# Patient Record
Sex: Female | Born: 1985 | Race: White | Hispanic: No | Marital: Single | Smoking: Current every day smoker
Health system: Southern US, Community
[De-identification: ages and names within clinical notes are randomized; demographics above are authoritative.]

## PROBLEM LIST (undated history)

## (undated) DIAGNOSIS — F909 Attention-deficit hyperactivity disorder, unspecified type: Secondary | ICD-10-CM

## (undated) DIAGNOSIS — N200 Calculus of kidney: Secondary | ICD-10-CM

## (undated) DIAGNOSIS — J45909 Unspecified asthma, uncomplicated: Secondary | ICD-10-CM

## (undated) DIAGNOSIS — F419 Anxiety disorder, unspecified: Secondary | ICD-10-CM

## (undated) HISTORY — PX: TONSILLECTOMY: SUR1361

## (undated) HISTORY — PX: OVARIAN CYST REMOVAL: SHX89

## (undated) HISTORY — PX: ELBOW FRACTURE SURGERY: SHX616

## (undated) HISTORY — PX: EYE MUSCLE SURGERY: SHX370

---

## 2016-07-30 DIAGNOSIS — E039 Hypothyroidism, unspecified: Secondary | ICD-10-CM | POA: Insufficient documentation

## 2016-07-30 DIAGNOSIS — G43909 Migraine, unspecified, not intractable, without status migrainosus: Secondary | ICD-10-CM | POA: Insufficient documentation

## 2016-07-30 DIAGNOSIS — F32A Depression, unspecified: Secondary | ICD-10-CM | POA: Insufficient documentation

## 2016-07-30 DIAGNOSIS — F419 Anxiety disorder, unspecified: Secondary | ICD-10-CM | POA: Insufficient documentation

## 2016-07-30 DIAGNOSIS — F988 Other specified behavioral and emotional disorders with onset usually occurring in childhood and adolescence: Secondary | ICD-10-CM | POA: Insufficient documentation

## 2016-07-30 DIAGNOSIS — F1920 Other psychoactive substance dependence, uncomplicated: Secondary | ICD-10-CM | POA: Insufficient documentation

## 2019-12-23 NOTE — Progress Notes (Deleted)
   New Patient Office Visit  Subjective:  Patient ID: Cassandra [name], female    DOB: 01-16-1985  Age: 34 y.o. MRN: [id number]  CC: No chief complaint on file.   HPI Cassandra [name] presents for ***  No past medical history on file.  *** The histories are not reviewed yet. Please review them in the "History" navigator section and refresh this SmartLink.  No family history on file.  Social History   Socioeconomic History  . Marital status: Single    Spouse name: Not on file  . Number of children: Not on file  . Years of education: Not on file  . Highest education level: Not on file  Occupational History  . Not on file  Tobacco Use  . Smoking status: Not on file  Substance and Sexual Activity  . Alcohol use: Not on file  . Drug use: Not on file  . Sexual activity: Not on file  Other Topics Concern  . Not on file  Social History Narrative  . Not on file   Social Determinants of Health   Financial Resource Strain:   . Difficulty of Paying Living Expenses: Not on file  Food Insecurity:   . Worried About Programme researcher, broadcasting/film/video in the Last Year: Not on file  . Ran Out of Food in the Last Year: Not on file  Transportation Needs:   . Lack of Transportation (Medical): Not on file  . Lack of Transportation (Non-Medical): Not on file  Physical Activity:   . Days of Exercise per Week: Not on file  . Minutes of Exercise per Session: Not on file  Stress:   . Feeling of Stress : Not on file  Social Connections:   . Frequency of Communication with Friends and Family: Not on file  . Frequency of Social Gatherings with Friends and Family: Not on file  . Attends Religious Services: Not on file  . Active Member of Clubs or Organizations: Not on file  . Attends Banker Meetings: Not on file  . Marital Status: Not on file  Intimate Partner Violence:   . Fear of Current or Ex-Partner: Not on file  . Emotionally Abused: Not on file  . Physically Abused: Not on file  .  Sexually Abused: Not on file    ROS Review of Systems  Objective:   Today's Vitals: There were no vitals taken for this visit.  Physical Exam  Assessment & Plan:   Problem List Items Addressed This Visit    None      No outpatient encounter medications on file as of 12/24/2019.   No facility-administered encounter medications on file as of 12/24/2019.    Follow-up: No follow-ups on file.   Jairo Ben, FNP

## 2019-12-24 ENCOUNTER — Ambulatory Visit: Payer: Self-pay | Admitting: Adult Health

## 2020-01-13 NOTE — Progress Notes (Signed)
   New Patient Office Visit  Subjective:  Patient ID: Cassandra [name], female    DOB: 18-May-1985  Age: 34 y.o. MRN: [id number]  No show for new patient appointment.

## 2020-01-14 ENCOUNTER — Ambulatory Visit (INDEPENDENT_AMBULATORY_CARE_PROVIDER_SITE_OTHER): Payer: Medicaid Other | Admitting: Adult Health

## 2020-01-14 DIAGNOSIS — Z91199 Patient's noncompliance with other medical treatment and regimen due to unspecified reason: Secondary | ICD-10-CM | POA: Insufficient documentation

## 2020-01-14 DIAGNOSIS — Z5329 Procedure and treatment not carried out because of patient's decision for other reasons: Secondary | ICD-10-CM

## 2020-04-04 ENCOUNTER — Emergency Department
Admission: EM | Admit: 2020-04-04 | Discharge: 2020-04-05 | Disposition: A | Discharge status: Home / Self Care | Payer: Medicaid Other | Attending: Emergency Medicine | Admitting: Emergency Medicine

## 2020-04-04 ENCOUNTER — Encounter: Payer: Self-pay | Admitting: Emergency Medicine

## 2020-04-04 ENCOUNTER — Emergency Department: Payer: Medicaid Other

## 2020-04-04 ENCOUNTER — Other Ambulatory Visit: Payer: Self-pay

## 2020-04-04 DIAGNOSIS — R112 Nausea with vomiting, unspecified: Secondary | ICD-10-CM

## 2020-04-04 DIAGNOSIS — F17200 Nicotine dependence, unspecified, uncomplicated: Secondary | ICD-10-CM | POA: Insufficient documentation

## 2020-04-04 DIAGNOSIS — O26891 Other specified pregnancy related conditions, first trimester: Secondary | ICD-10-CM | POA: Insufficient documentation

## 2020-04-04 DIAGNOSIS — Z3A10 10 weeks gestation of pregnancy: Secondary | ICD-10-CM | POA: Insufficient documentation

## 2020-04-04 DIAGNOSIS — O99331 Smoking (tobacco) complicating pregnancy, first trimester: Secondary | ICD-10-CM | POA: Insufficient documentation

## 2020-04-04 DIAGNOSIS — R1030 Lower abdominal pain, unspecified: Secondary | ICD-10-CM | POA: Diagnosis not present

## 2020-04-04 DIAGNOSIS — O219 Vomiting of pregnancy, unspecified: Secondary | ICD-10-CM | POA: Insufficient documentation

## 2020-04-04 DIAGNOSIS — R109 Unspecified abdominal pain: Secondary | ICD-10-CM

## 2020-04-04 HISTORY — DX: Calculus of kidney: N20.0

## 2020-04-04 LAB — CBC
HCT: 38.9 % (ref 36.0–46.0)
Hemoglobin: 13 g/dL (ref 12.0–15.0)
MCH: 28.1 pg (ref 26.0–34.0)
MCHC: 33.4 g/dL (ref 30.0–36.0)
MCV: 84 fL (ref 80.0–100.0)
Platelets: 371 10*3/uL (ref 150–400)
RBC: 4.63 MIL/uL (ref 3.87–5.11)
RDW: 12.1 % (ref 11.5–15.5)
WBC: 9.9 10*3/uL (ref 4.0–10.5)
nRBC: 0 % (ref 0.0–0.2)

## 2020-04-04 LAB — COMPREHENSIVE METABOLIC PANEL
ALT: 26 U/L (ref 0–44)
AST: 34 U/L (ref 15–41)
Albumin: 4.4 g/dL (ref 3.5–5.0)
Alkaline Phosphatase: 45 U/L (ref 38–126)
Anion gap: 9 (ref 5–15)
BUN: 11 mg/dL (ref 6–20)
CO2: 22 mmol/L (ref 22–32)
Calcium: 9.1 mg/dL (ref 8.9–10.3)
Chloride: 102 mmol/L (ref 98–111)
Creatinine, Ser: 0.41 mg/dL — ABNORMAL LOW (ref 0.44–1.00)
GFR, Estimated: >60 mL/min (ref >60)
Glucose, Bld: 91 mg/dL (ref 70–99)
Potassium: 3.7 mmol/L (ref 3.5–5.1)
Sodium: 133 mmol/L — ABNORMAL LOW (ref 135–145)
Total Bilirubin: 0.4 mg/dL (ref 0.3–1.2)
Total Protein: 7.5 g/dL (ref 6.5–8.1)

## 2020-04-04 LAB — URINALYSIS, COMPLETE (UACMP) WITH MICROSCOPIC
Bilirubin Urine: NEGATIVE
Glucose, UA: NEGATIVE mg/dL
Ketones, ur: 5 mg/dL — AB
Nitrite: NEGATIVE
Protein, ur: NEGATIVE mg/dL
Specific Gravity, Urine: 1.01 (ref 1.005–1.030)
pH: 6 (ref 5.0–8.0)

## 2020-04-04 LAB — LIPASE, BLOOD: Lipase: 28 U/L (ref 11–51)

## 2020-04-04 LAB — POC URINE PREG, ED: Preg Test, Ur: POSITIVE — AB

## 2020-04-04 LAB — HCG, QUANTITATIVE, PREGNANCY: hCG, Beta Chain, Quant, S: 171659 m[IU]/mL — ABNORMAL HIGH (ref <5)

## 2020-04-04 MED ORDER — PROMETHAZINE HCL 25 MG/ML IJ SOLN
25.0000 mg | Freq: Once | INTRAMUSCULAR | Status: AC
  Administered 2020-04-04: 25 mg via INTRAMUSCULAR
  Filled 2020-04-04: qty 1

## 2020-04-04 NOTE — ED Notes (Signed)
Patient transported to Ultrasound 

## 2020-04-04 NOTE — ED Provider Notes (Addendum)
[facility] Emergency Department Provider Note  ____________________________________________  Time seen: Approximately 8:37 PM  I have reviewed the triage vital signs and the nursing notes.   HISTORY  Chief Complaint Abdominal Pain and Emesis    HPI Cassandra [name] is a 35 y.o. female who presents the emergency department complaining of nausea, vomiting, lower abdominal pain.  Patient states that she has felt "off" some intermittent nausea and vomiting for the past several weeks.  Patient states that her last menstrual cycle was in January and given her symptoms she believes that she may be pregnant.  She is not taken home pregnancy test.  She had of one of her children's liquid Zofran at home and states that this has not been alleviating her symptoms.  No fevers or chills.  Nonbilious and nonbloody vomit.  No diarrhea or constipation.  Patient states that the pain she is experiencing is more of a cramp versus a true pain.  Does not radiate.  Patient does have a history of nephrolithiasis but has had no hematuria.  She has had some urinary frequency but no dysuria.  No vaginal bleeding or discharge.         Past Medical History:  Diagnosis Date  . Kidney stone     Patient Active Problem List   Diagnosis Date Noted  . No-show for appointment 01/14/2020    History reviewed. No pertinent surgical history.  Prior to Admission medications   Not on File    Allergies Patient has no known allergies.  No family history on file.  Social History Social History   Tobacco Use  . Smoking status: Current Every Day Smoker  . Smokeless tobacco: Never Used  Substance Use Topics  . Alcohol use: Not Currently  . Drug use: Not Currently     Review of Systems  Constitutional: No fever/chills Eyes: No visual changes. No discharge ENT: No upper respiratory complaints. Cardiovascular: no chest pain. Respiratory: no cough. No SOB. Gastrointestinal: Lower  abdominal cramping.  Positive for ongoing nausea vomiting.  No diarrhea.  No constipation. Genitourinary: Positive for polyuria.  Negative for dysuria. No hematuria Musculoskeletal: Negative for musculoskeletal pain. Skin: Negative for rash, abrasions, lacerations, ecchymosis. Neurological: Negative for headaches, focal weakness or numbness.  10 System ROS otherwise negative.  ____________________________________________   PHYSICAL EXAM:  VITAL SIGNS: ED Triage Vitals  Enc Vitals Group     BP 04/04/20 1830 134/85     Pulse Rate 04/04/20 1830 100     Resp 04/04/20 1830 (!) 22     Temp 04/04/20 1830 97.8 F (36.6 C)     Temp Source 04/04/20 1830 Oral     SpO2 04/04/20 1830 98 %     Weight 04/04/20 1838 120 lb (54.4 kg)     Height 04/04/20 1838 5\' 2"  (1.575 m)     Head Circumference --      Peak Flow --      Pain Score 04/04/20 1838 8     Pain Loc --      Pain Edu? --      Excl. in GC? --      Constitutional: Alert and oriented. Well appearing and in no acute distress. Eyes: Conjunctivae are normal. PERRL. EOMI. Head: Atraumatic. ENT:      Ears:       Nose: No congestion/rhinnorhea.      Mouth/Throat: Mucous membranes are moist.  Neck: No stridor.   Hematological/Lymphatic/Immunilogical: No cervical lymphadenopathy. Cardiovascular: Normal rate, regular rhythm. Normal S1  and S2.  Good peripheral circulation. Respiratory: Normal respiratory effort without tachypnea or retractions. Lungs CTAB. Good air entry to the bases with no decreased or absent breath sounds. Gastrointestinal: Exam of external abdominal wall reveals no acute findings.  Bowel sounds 4 quadrants. Soft and nontender to palpation all 4 quadrants.  Mild tenderness in the suprapubic region.. No guarding or rigidity. No palpable masses. No distention. No CVA tenderness. Musculoskeletal: Full range of motion to all extremities. No gross deformities appreciated. Neurologic:  Normal speech and language. No gross  focal neurologic deficits are appreciated.  Skin:  Skin is warm, dry and intact. No rash noted. Psychiatric: Mood and affect are normal. Speech and behavior are normal. Patient exhibits appropriate insight and judgement.   ____________________________________________   LABS (all labs ordered are listed, but only abnormal results are displayed)  Labs Reviewed  COMPREHENSIVE METABOLIC PANEL - Abnormal; Notable for the following components:      Result Value   Sodium 133 (*)    Creatinine, Ser 0.41 (*)    All other components within normal limits  URINALYSIS, COMPLETE (UACMP) WITH MICROSCOPIC - Abnormal; Notable for the following components:   Color, Urine YELLOW (*)    APPearance HAZY (*)    Hgb urine dipstick SMALL (*)    Ketones, ur 5 (*)    Leukocytes,Ua TRACE (*)    Bacteria, UA RARE (*)    All other components within normal limits  HCG, QUANTITATIVE, PREGNANCY - Abnormal; Notable for the following components:   hCG, Beta Chain, Quant, S 171,659 (*)    All other components within normal limits  POC URINE PREG, ED - Abnormal; Notable for the following components:   Preg Test, Ur Positive (*)    All other components within normal limits  URINE CULTURE  LIPASE, BLOOD  CBC   ____________________________________________  EKG   ____________________________________________  RADIOLOGY   No results found.  ____________________________________________    PROCEDURES  Procedure(s) performed:    Procedures    Medications  promethazine (PHENERGAN) injection 25 mg (25 mg Intramuscular Given 04/04/20 2317)     ____________________________________________   INITIAL IMPRESSION / ASSESSMENT AND PLAN / ED COURSE  Pertinent labs & imaging results that were available during my care of the patient were reviewed by me and considered in my medical decision making (see chart for details).  Review of the [place] CSRS was performed in accordance of the [facility] prior to dispensing  any controlled drugs.          Patient presented to emergency department for off and on nausea and vomiting x2 months.  Patient's last menstrual cycle was in January and she states "I felt like it might be pregnant based off of this."  Patient not had not establish care with OB/GYN.  She had not been taking medications.  Patient had some mild increase in urination as well as some suprapubic discomfort and she was not sure whether "this was my pregnancy or I might have a UTI."  Patient does not have any antiemetics at home.  No fevers or chills, no URI symptoms, no chest pain, shortness of breath.  Patient denies any vaginal bleeding or vaginal discharge.  Patient does have a history of nephrolithiasis.  Given the urinary frequency, vague abdominal discomfort, I ordered ultrasounds for evaluation of pregnancy as well as a renal ultrasound to ensure no hydronephrosis or pyelonephritis.  Patient did have some bacteria and leukocytes in her urine and will treat for bacteriuria.  When patient was  to go for ultrasound she had an episode of nausea and vomiting, was given antiemetics with good improvement.  At this time, we are waiting ultrasound for final diagnoses and disposition.  I have handed the patient off to Dr. Elesa Massed for final diagnosis and disposition.  Assuming that ultrasounds are reassuring, I suspect the patient will be discharged with antibiotics and OB/GYN follow-up.     This chart was dictated using voice recognition software/Dragon. Despite best efforts to proofread, errors can occur which can change the meaning. Any change was purely unintentional.        Racheal Patches, PA-C 04/05/20 0015    Ward, Layla Maw, DO 04/05/20 [phone]

## 2020-04-04 NOTE — ED Triage Notes (Signed)
Pt to ED via POV with c/o emesis and abdominal pain x several days. Pt states also has HA at this time. Pt states she feels shakey and dizzy at this time. Pt A&O x4, ambulatory at this time.

## 2020-04-04 NOTE — ED Notes (Signed)
Secure chat to Rake PA for requested nausea medication.

## 2020-04-05 ENCOUNTER — Encounter: Payer: Self-pay | Admitting: Radiology

## 2020-04-05 MED ORDER — METOCLOPRAMIDE HCL 10 MG PO TABS: 10.0000 mg | ORAL_TABLET | Freq: Four times a day (QID) | ORAL | 0 refills | Status: DC | PRN

## 2020-04-05 NOTE — Discharge Instructions (Addendum)
You may take Tylenol 1000 mg every 6 hours as needed for pain.  This medication is found over-the-counter.  Please avoid NSAIDs such as ibuprofen, aspirin, Aleve, Goody powders while pregnant.  Your ultrasound showed a pregnancy measuring about 10 weeks and 1 day with an estimated due date of 10/30/2020.  I recommend that you start taking over the counter prenatal vitamins and follow-up with an OB/GYN as an outpatient for further prenatal care.

## 2020-04-05 NOTE — ED Provider Notes (Signed)
12:10 AM  Assumed care.  Patient here with abdominal cramping, nausea and vomiting.  History of nephrolithiasis.  Found to be pregnant here.  Urine shows trace leukocytes and rare bacteria.  Will cover with antibiotics.  Culture pending.  OB and renal ultrasound pending for disposition.  1:17 AM  Pt's renal ultrasound shows no acute abnormality.  No hydronephrosis, urolithiasis.  OB ultrasound shows a single intrauterine pregnancy that measures 10 weeks and 1 day with an estimated due date of 10/30/2020 without other acute abnormality.  She reports feeling better at this time.  Pain and nausea have improved.  She is requesting something for nausea at home as she states Zofran has not been helping her.  Will discharge with Reglan.  Recommended Tylenol as needed for pain.  Will give outpatient OB follow-up.  She denies any vaginal bleeding.  I feel she is safe for discharge home.   At this time, I do not feel there is any life-threatening condition present. I have reviewed, interpreted and discussed all results (EKG, imaging, lab, urine as appropriate) and exam findings with patient/family. I have reviewed nursing notes and appropriate previous records.  I feel the patient is safe to be discharged home without further emergent workup and can continue workup as an outpatient as needed. Discussed usual and customary return precautions. Patient/family verbalize understanding and are comfortable with this plan.  Outpatient follow-up has been provided as needed. All questions have been answered.    Zair Borawski, Layla Maw, DO 04/05/20 [phone]

## 2020-04-06 LAB — URINE CULTURE

## 2020-09-13 ENCOUNTER — Other Ambulatory Visit (HOSPITAL_COMMUNITY)
Admission: RE | Admit: 2020-09-13 | Discharge: 2020-09-13 | Disposition: A | Discharge status: Home / Self Care | Payer: Medicaid Other | Source: Ambulatory Visit | Attending: Obstetrics | Admitting: Obstetrics

## 2020-09-13 ENCOUNTER — Other Ambulatory Visit: Payer: Self-pay

## 2020-09-13 ENCOUNTER — Ambulatory Visit (INDEPENDENT_AMBULATORY_CARE_PROVIDER_SITE_OTHER): Payer: Medicaid Other | Admitting: Obstetrics

## 2020-09-13 ENCOUNTER — Encounter: Payer: Self-pay | Admitting: Obstetrics

## 2020-09-13 VITALS — BP 128/78 | HR 94 | Wt 123.0 lb

## 2020-09-13 DIAGNOSIS — Z3A34 34 weeks gestation of pregnancy: Secondary | ICD-10-CM | POA: Insufficient documentation

## 2020-09-13 DIAGNOSIS — O0990 Supervision of high risk pregnancy, unspecified, unspecified trimester: Secondary | ICD-10-CM | POA: Diagnosis present

## 2020-09-13 DIAGNOSIS — O99323 Drug use complicating pregnancy, third trimester: Secondary | ICD-10-CM

## 2020-09-13 DIAGNOSIS — Z3480 Encounter for supervision of other normal pregnancy, unspecified trimester: Secondary | ICD-10-CM

## 2020-09-13 DIAGNOSIS — O0933 Supervision of pregnancy with insufficient antenatal care, third trimester: Secondary | ICD-10-CM | POA: Insufficient documentation

## 2020-09-13 LAB — OB RESULTS CONSOLE GC/CHLAMYDIA
Chlamydia: NEGATIVE
Gonorrhea: NEGATIVE

## 2020-09-13 NOTE — Progress Notes (Signed)
NOB - No prenatal care, just seen Health for her for ultrasound. RM 4
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
## 2020-09-14 LAB — CYTOLOGY - PAP
Adequacy: ABSENT
Chlamydia: NEGATIVE
Comment: NEGATIVE
Comment: NEGATIVE
Comment: NEGATIVE
Comment: NORMAL
Diagnosis: NEGATIVE
High risk HPV: NEGATIVE
Neisseria Gonorrhea: NEGATIVE
Trichomonas: NEGATIVE

## 2020-09-15 ENCOUNTER — Telehealth: Payer: Self-pay

## 2020-09-16 LAB — URINE CULTURE: Organism ID, Bacteria: NO GROWTH

## 2020-09-17 LAB — URINE DRUG PANEL 7
Amphetamines, Urine: POSITIVE — AB
Barbiturate Quant, Ur: NEGATIVE ng/mL
Benzodiazepine Quant, Ur: NEGATIVE ng/mL
Cannabinoid Quant, Ur: NEGATIVE ng/mL
Cocaine (Metab.): NEGATIVE ng/mL
Opiate Quant, Ur: NEGATIVE ng/mL
PCP Quant, Ur: NEGATIVE ng/mL

## 2020-09-18 ENCOUNTER — Encounter: Payer: Self-pay | Admitting: Obstetrics and Gynecology

## 2020-09-18 ENCOUNTER — Inpatient Hospital Stay
Admission: EM | Admit: 2020-09-18 | Discharge: 2020-09-19 | DRG: 776 | Disposition: A | Discharge status: Home / Self Care | Payer: Medicaid Other | Attending: Advanced Practice Midwife | Admitting: Advanced Practice Midwife

## 2020-09-18 ENCOUNTER — Other Ambulatory Visit: Payer: Self-pay

## 2020-09-18 DIAGNOSIS — Z87442 Personal history of urinary calculi: Secondary | ICD-10-CM

## 2020-09-18 DIAGNOSIS — Z20822 Contact with and (suspected) exposure to covid-19: Secondary | ICD-10-CM | POA: Diagnosis present

## 2020-09-18 DIAGNOSIS — O0993 Supervision of high risk pregnancy, unspecified, third trimester: Secondary | ICD-10-CM

## 2020-09-18 DIAGNOSIS — Z880 Allergy status to penicillin: Secondary | ICD-10-CM | POA: Diagnosis not present

## 2020-09-18 DIAGNOSIS — Z3A34 34 weeks gestation of pregnancy: Secondary | ICD-10-CM

## 2020-09-18 DIAGNOSIS — O99323 Drug use complicating pregnancy, third trimester: Secondary | ICD-10-CM

## 2020-09-18 HISTORY — DX: Unspecified asthma, uncomplicated: J45.909

## 2020-09-18 HISTORY — DX: Anxiety disorder, unspecified: F41.9

## 2020-09-18 HISTORY — DX: Attention-deficit hyperactivity disorder, unspecified type: F90.9

## 2020-09-18 LAB — URINE DRUG SCREEN, QUALITATIVE (ARMC ONLY)
Amphetamines, Ur Screen: POSITIVE — AB
Barbiturates, Ur Screen: NOT DETECTED
Benzodiazepine, Ur Scrn: NOT DETECTED
Cannabinoid 50 Ng, Ur ~~LOC~~: NOT DETECTED
Cocaine Metabolite,Ur ~~LOC~~: NOT DETECTED
MDMA (Ecstasy)Ur Screen: NOT DETECTED
Methadone Scn, Ur: NOT DETECTED
Opiate, Ur Screen: NOT DETECTED
Phencyclidine (PCP) Ur S: NOT DETECTED
Tricyclic, Ur Screen: NOT DETECTED

## 2020-09-18 LAB — RAPID HIV SCREEN (HIV 1/2 AB+AG)
HIV 1/2 Antibodies: NONREACTIVE
HIV-1 P24 Antigen - HIV24: NONREACTIVE

## 2020-09-18 LAB — ABO/RH: ABO/RH(D): O POS

## 2020-09-18 LAB — RESP PANEL BY RT-PCR (FLU A&B, COVID) ARPGX2
Influenza A by PCR: NEGATIVE
Influenza B by PCR: NEGATIVE
SARS Coronavirus 2 by RT PCR: NEGATIVE

## 2020-09-18 LAB — TYPE AND SCREEN
ABO/RH(D): O POS
Antibody Screen: NEGATIVE

## 2020-09-18 MED ORDER — TETANUS-DIPHTH-ACELL PERTUSSIS 5-2.5-18.5 LF-MCG/0.5 IM SUSY: 0.5000 mL | PREFILLED_SYRINGE | Freq: Once | INTRAMUSCULAR | Status: DC

## 2020-09-18 MED ORDER — AMPHETAMINE-DEXTROAMPHET ER 30 MG PO CP24
30.0000 mg | ORAL_CAPSULE | Freq: Two times a day (BID) | ORAL | Status: DC
  Administered 2020-09-18 – 2020-09-19 (×3): 30 mg via ORAL
  Filled 2020-09-18: qty 1
  Filled 2020-09-18 (×2): qty 6

## 2020-09-18 MED ORDER — ACETAMINOPHEN 325 MG PO TABS
650.0000 mg | ORAL_TABLET | ORAL | Status: DC | PRN
  Administered 2020-09-19: 650 mg via ORAL
  Filled 2020-09-18: qty 2

## 2020-09-18 MED ORDER — ONDANSETRON HCL 4 MG PO TABS: 4.0000 mg | ORAL_TABLET | ORAL | Status: DC | PRN

## 2020-09-18 MED ORDER — LACTATED RINGERS IV SOLN: 500.0000 mL | INTRAVENOUS | Status: DC | PRN

## 2020-09-18 MED ORDER — PRENATAL MULTIVITAMIN CH: 1.0000 | ORAL_TABLET | Freq: Every day | ORAL | Status: DC

## 2020-09-18 MED ORDER — WITCH HAZEL-GLYCERIN EX PADS: 1.0000 "application " | MEDICATED_PAD | CUTANEOUS | Status: DC | PRN

## 2020-09-18 MED ORDER — BUPRENORPHINE HCL 8 MG SL SUBL
8.0000 mg | SUBLINGUAL_TABLET | Freq: Two times a day (BID) | SUBLINGUAL | Status: DC
  Administered 2020-09-18 – 2020-09-19 (×3): 8 mg via SUBLINGUAL
  Filled 2020-09-18 (×3): qty 1

## 2020-09-18 MED ORDER — COCONUT OIL OIL
1.0000 "application " | TOPICAL_OIL | Status: DC | PRN
  Filled 2020-09-18: qty 120

## 2020-09-18 MED ORDER — IBUPROFEN 600 MG PO TABS: 600.0000 mg | ORAL_TABLET | Freq: Four times a day (QID) | ORAL | Status: DC

## 2020-09-18 MED ORDER — SOD CITRATE-CITRIC ACID 500-334 MG/5ML PO SOLN: 30.0000 mL | ORAL | Status: DC | PRN

## 2020-09-18 MED ORDER — DIPHENHYDRAMINE HCL 25 MG PO CAPS: 25.0000 mg | ORAL_CAPSULE | Freq: Four times a day (QID) | ORAL | Status: DC | PRN

## 2020-09-18 MED ORDER — ONDANSETRON HCL 4 MG PO TABS
4.0000 mg | ORAL_TABLET | ORAL | Status: DC | PRN
  Filled 2020-09-18: qty 1

## 2020-09-18 MED ORDER — ZOLPIDEM TARTRATE 5 MG PO TABS: 5.0000 mg | ORAL_TABLET | Freq: Every evening | ORAL | Status: DC | PRN

## 2020-09-18 MED ORDER — IBUPROFEN 600 MG PO TABS
ORAL_TABLET | ORAL | Status: AC
  Filled 2020-09-18: qty 1

## 2020-09-18 MED ORDER — OXYTOCIN BOLUS FROM INFUSION: 333.0000 mL | Freq: Once | INTRAVENOUS | Status: DC

## 2020-09-18 MED ORDER — DIBUCAINE (PERIANAL) 1 % EX OINT: 1.0000 "application " | TOPICAL_OINTMENT | CUTANEOUS | Status: DC | PRN

## 2020-09-18 MED ORDER — ACETAMINOPHEN 500 MG PO TABS
1000.0000 mg | ORAL_TABLET | Freq: Four times a day (QID) | ORAL | Status: DC
  Administered 2020-09-18: 1000 mg via ORAL
  Filled 2020-09-18: qty 2

## 2020-09-18 MED ORDER — SIMETHICONE 80 MG PO CHEW: 80.0000 mg | CHEWABLE_TABLET | ORAL | Status: DC | PRN

## 2020-09-18 MED ORDER — LACTATED RINGERS IV SOLN: INTRAVENOUS | Status: DC

## 2020-09-18 MED ORDER — ONDANSETRON HCL 4 MG/2ML IJ SOLN: 4.0000 mg | INTRAMUSCULAR | Status: DC | PRN

## 2020-09-18 MED ORDER — PRENATAL MULTIVITAMIN CH
1.0000 | ORAL_TABLET | Freq: Every day | ORAL | Status: DC
  Administered 2020-09-19: 1 via ORAL
  Filled 2020-09-18: qty 1

## 2020-09-18 MED ORDER — IBUPROFEN 600 MG PO TABS
600.0000 mg | ORAL_TABLET | Freq: Four times a day (QID) | ORAL | Status: DC
  Administered 2020-09-18 – 2020-09-19 (×5): 600 mg via ORAL
  Filled 2020-09-18 (×4): qty 1

## 2020-09-18 MED ORDER — LIDOCAINE HCL (PF) 1 % IJ SOLN: 30.0000 mL | INTRAMUSCULAR | Status: DC | PRN

## 2020-09-18 MED ORDER — ESCITALOPRAM OXALATE 10 MG PO TABS
20.0000 mg | ORAL_TABLET | Freq: Every day | ORAL | Status: DC
  Administered 2020-09-18 – 2020-09-19 (×2): 20 mg via ORAL
  Filled 2020-09-18: qty 2
  Filled 2020-09-18: qty 1

## 2020-09-18 MED ORDER — AMPHETAMINE-DEXTROAMPHET ER 5 MG PO CP24
30.0000 mg | ORAL_CAPSULE | Freq: Two times a day (BID) | ORAL | Status: DC
  Filled 2020-09-18: qty 1

## 2020-09-18 MED ORDER — BENZOCAINE-MENTHOL 20-0.5 % EX AERO: 1.0000 "application " | INHALATION_SPRAY | CUTANEOUS | Status: DC | PRN

## 2020-09-18 MED ORDER — BUPROPION HCL ER (SR) 150 MG PO TB12
150.0000 mg | ORAL_TABLET | Freq: Every morning | ORAL | Status: DC
  Administered 2020-09-18 – 2020-09-19 (×2): 150 mg via ORAL
  Filled 2020-09-18 (×2): qty 1

## 2020-09-18 MED ORDER — OXYTOCIN-SODIUM CHLORIDE 30-0.9 UT/500ML-% IV SOLN: 2.5000 [IU]/h | INTRAVENOUS | Status: DC

## 2020-09-18 MED ORDER — DOCUSATE SODIUM 100 MG PO CAPS
100.0000 mg | ORAL_CAPSULE | Freq: Two times a day (BID) | ORAL | Status: DC
  Administered 2020-09-18 – 2020-09-19 (×2): 100 mg via ORAL
  Filled 2020-09-18 (×2): qty 1

## 2020-09-18 MED ORDER — TRAZODONE HCL 100 MG PO TABS
200.0000 mg | ORAL_TABLET | Freq: Every evening | ORAL | Status: DC | PRN
  Administered 2020-09-18: 200 mg via ORAL
  Filled 2020-09-18 (×3): qty 2

## 2020-09-18 MED ORDER — ONDANSETRON HCL 4 MG/2ML IJ SOLN: 4.0000 mg | Freq: Four times a day (QID) | INTRAMUSCULAR | Status: DC | PRN

## 2020-09-18 MED ORDER — COCONUT OIL OIL: 1.0000 "application " | TOPICAL_OIL | Status: DC | PRN

## 2020-09-18 MED ORDER — SENNOSIDES-DOCUSATE SODIUM 8.6-50 MG PO TABS
2.0000 | ORAL_TABLET | Freq: Every day | ORAL | Status: DC
  Administered 2020-09-19: 2 via ORAL
  Filled 2020-09-18: qty 2

## 2020-09-18 NOTE — H&P (Addendum)
OB History & Physical   History of Present Illness:  Chief Complaint: SVD and delivery of placenta at home, transported to hospital by EMS. Stable on admission. Newborn to [facility].  HPI:  Cassandra [name] is a 35 y.o. [phone] female at [redacted]w[redacted]d dated by 10 week u/s.  Her pregnancy has been complicated by substance abuse currently on subutex, Adderall, Wellbutrin, Lexapro, Trazodone; mental illness; previous tubal pregnancy.    She was at home this morning and feeling contractions that she thought were BH. She went to the bathroom and her water broke and subsequent delivery of female infant at 8:45 AM. Placenta was also delivered at home. She then was transported to the hospital by EMS and was stable on arrival. Her newborn was taken to the special care nursery for transition care. She has 3 other children that live in the home. She signed a consent for BTL last week at clinic visit per her report.  Total weight gain for pregnancy: 8.165 kg   Obstetrical Problem List: pregnancy 4 Problems (from 09/13/20 to present)     Problem Noted Resolved   Supervision of high risk pregnancy, antepartum 09/13/2020 by Mirna Mires, CNM No   Overview Addendum 09/15/2020 12:50 PM by Mirna Mires, CNM     Nursing Staff Provider  Office Location  [facility] Dating    Language  English Anatomy US    Flu Vaccine   Genetic Screen  NIPS:   TDaP vaccine    Hgb A1C or  GTT Early : Third trimester :   Covid    LAB RESULTS   Rhogam   Blood Type     Feeding Plan  Breast? Antibody    Contraception Tubal only one tube Rubella    Circumcision  RPR     Pediatrician   HBsAg     Support Person  HIV    Prenatal Classes  Varicella     GBS  (For PCN allergy, check sensitivities)   BTL Consent Signed 09/13/20    VBAC Consent  Pap 09/13/2020 NILM    Hgb Electro      CF      SMA              Late prenatal care in third trimester 09/13/2020 by Mirna Mires, CNM No   Overview Signed 09/13/2020 12:12 PM by Mirna Mires, CNM    NOB at approximately [redacted] weeks gestation on 8/30 2022           Maternal Medical History:   Past Medical History:  Diagnosis Date   ADHD (attention deficit hyperactivity disorder)    Anxiety    Asthma    Kidney stone     Past Surgical History:  Procedure Laterality Date   ELBOW FRACTURE SURGERY     EYE MUSCLE SURGERY Bilateral    OVARIAN CYST REMOVAL Right    TONSILLECTOMY      Allergies  Allergen Reactions   Metronidazole Nausea And Vomiting   Prednisone Other (See Comments)    irritable/ angry and very lethargic when finishes    Prior to Admission medications   Medication Sig Start Date End Date Taking? Authorizing Provider  ADDERALL XR 30 MG 24 hr capsule Take 30 mg by mouth 2 (two) times daily. 09/01/20  Yes [provider]  buprenorphine (SUBUTEX) 8 MG SUBL SL tablet Place 8 mg under the tongue 2 (two) times daily. 09/09/20  Yes [provider]  buPROPion (WELLBUTRIN SR) 150 MG 12 hr  tablet Take 150 mg by mouth every morning. 08/21/20  Yes [provider]  escitalopram (LEXAPRO) 20 MG tablet Take 20 mg by mouth daily. 09/01/20  Yes [provider]  traZODone (DESYREL) 100 MG tablet Take 200 mg by mouth at bedtime as needed. 09/13/20  Yes [provider]  amoxicillin-clavulanate (AUGMENTIN) 875-125 MG tablet SMARTSIG:1 Tablet(s) By Mouth Every 12 Hours Patient not taking: No sig reported 09/09/20   [provider]  metoCLOPramide (REGLAN) 10 MG tablet Take 1 tablet (10 mg total) by mouth every 6 (six) hours as needed for nausea. Patient not taking: No sig reported 04/05/20 04/05/21  Ward, Baxter Hire N, DO  ondansetron (ZOFRAN-ODT) 4 MG disintegrating tablet Take 4 mg by mouth every 8 (eight) hours as needed. 09/01/20   [provider]  promethazine (PHENERGAN) 25 MG suppository SMARTSIG:1 SUPPOS Rectally Every 12 Hours PRN Patient not taking: Reported on 09/18/2020 09/08/20   [provider]    OB  History  Gravida Para Term Preterm AB Living  1            SAB IAB Ectopic Multiple Live Births               # Outcome Date GA Lbr Len/2nd Weight Sex Delivery Anes PTL Lv  1 Current             Prenatal care site: [facility], 1 prenatal visit  Social History: She  reports that she has quit smoking. Her smoking use included cigarettes. She has never used smokeless tobacco. She reports that she does not currently use alcohol. She reports that she does not currently use drugs.  Family History: no family history of breast or ovarian cancer   Review of Systems:  Review of Systems  Constitutional:  Negative for chills and fever.  HENT:  Negative for congestion, ear discharge, ear pain, hearing loss, sinus pain and sore throat.   Eyes:  Negative for blurred vision and double vision.  Respiratory:  Negative for cough, shortness of breath and wheezing.   Cardiovascular:  Negative for chest pain, palpitations and leg swelling.  Gastrointestinal:  Positive for abdominal pain. Negative for blood in stool, constipation, diarrhea, heartburn, melena, nausea and vomiting.  Genitourinary:  Negative for dysuria, flank pain, frequency, hematuria and urgency.  Musculoskeletal:  Negative for back pain, joint pain and myalgias.  Skin:  Negative for itching and rash.  Neurological:  Negative for dizziness, tingling, tremors, sensory change, speech change, focal weakness, seizures, loss of consciousness, weakness and headaches.  Endo/Heme/Allergies:  Negative for environmental allergies. Does not bruise/bleed easily.  Psychiatric/Behavioral:  Negative for depression, hallucinations, memory loss, substance abuse and suicidal ideas. The patient is not nervous/anxious and does not have insomnia.     Physical Exam:  BP 120/78 (BP Location: Left Arm)   Pulse 70   Temp 98.3 F (36.8 C) (Oral)   Resp 16   Ht 5\' 2"  (1.575 m)   Wt 53.5 kg Comment: 118lbs  SpO2 100%   BMI 21.58 kg/m   Constitutional:  petite, well developed female in no acute distress.  HEENT: normal Skin: Warm and dry.  Cardiovascular: Regular rate and rhythm.   Extremity:  no edema   Respiratory: Clear to auscultation bilateral. Normal respiratory effort Abdomen: fundus firm below U Psych: Alert and Oriented x3. No memory deficits. Normal mood and affect.   Pelvic exam: (female chaperone present) is not limited by body habitus EGBUS: within normal limit Vagina: within normal limits and with  normal mucosa, blood in the vault, no lesions or lacerations on inspection, amount of bleeding wnl    Lab Results  Component Value Date   SARSCOV2NAA NEGATIVE 09/18/2020    Assessment:  Cassandra [name] is a 35 y.o. [id number] female at 16 weeks postpartum, stable  Plan:  Admit to Postpartum CBC, T&S, RPR, HIV Regular diet Newborn in SCN- patient would like to breastfeed- will consult with [facility] regarding patient medications Placenta to pathology Home medications ordered including Subutex Desires interval tubal- consent signed 09/13/20   Tresea Mall, CNM 09/18/2020 1:47 PM

## 2020-09-19 DIAGNOSIS — O99323 Drug use complicating pregnancy, third trimester: Secondary | ICD-10-CM

## 2020-09-19 LAB — CBC
HCT: 30.1 % — ABNORMAL LOW (ref 36.0–46.0)
Hemoglobin: 9.8 g/dL — ABNORMAL LOW (ref 12.0–15.0)
MCH: 25.5 pg — ABNORMAL LOW (ref 26.0–34.0)
MCHC: 32.6 g/dL (ref 30.0–36.0)
MCV: 78.2 fL — ABNORMAL LOW (ref 80.0–100.0)
Platelets: 517 10*3/uL — ABNORMAL HIGH (ref 150–400)
RBC: 3.85 MIL/uL — ABNORMAL LOW (ref 3.87–5.11)
RDW: 14.6 % (ref 11.5–15.5)
WBC: 18.5 10*3/uL — ABNORMAL HIGH (ref 4.0–10.5)
nRBC: 0 % (ref 0.0–0.2)

## 2020-09-19 LAB — RPR: RPR Ser Ql: NONREACTIVE

## 2020-09-19 LAB — HEPATITIS B SURFACE ANTIGEN: Hepatitis B Surface Ag: NONREACTIVE

## 2020-09-19 MED ORDER — IBUPROFEN 600 MG PO TABS: 600.0000 mg | ORAL_TABLET | Freq: Four times a day (QID) | ORAL | 0 refills | Status: DC

## 2020-09-19 NOTE — Discharge Summary (Signed)
Postpartum Discharge Summary  Date of Service updated09/05/2020     Patient Name: Cassandra [name] DOB: 1985/06/17 MRN: [id number]  Date of admission: 09/18/2020 Delivery date:09/18/2020  Delivering provider:   Date of discharge: 09/19/2020  Admitting diagnosis: Vaginal delivery [O80] Postpartum care following vaginal delivery [Z39.2] Intrauterine pregnancy: [redacted]w[redacted]d    Secondary diagnosis:  Active Problems:   Vaginal delivery   Postpartum care following vaginal delivery   Supervision of high risk pregnancy in third trimester   [redacted] weeks gestation of pregnancy   Encounter for postpartum care after unplanned out of hospital delivery   Substance abuse affecting pregnancy in third trimester, antepartum  Additional problems: substance abuse    Discharge diagnosis: Preterm Pregnancy Delivered and Delviery outside the hospital                                               Post partum procedures: none Augmentation: N/A Complications: None  Hospital course: Patient brought to the hospital by EMS having delivered at home. Preterm delivery, SVD over intact perineum. Her postpartum course marked only by concerns of ongoing substance use, and little prenatal care. She is discharged home on postpartum day 1 after seeing the SEducation officer, museum with [facility] [place] [facility]  Magnesium Sulfate received: No BMZ received: No Rhophylac:N/A MMR:No T-DaP:Given postpartum Flu: No Transfusion:No  Physical exam  Vitals:   09/18/20 2037 09/19/20 0018 09/19/20 0445 09/19/20 0917  BP: 131/87 108/67 122/73 117/77  Pulse: 90 83 87 88  Resp: _0 Temp: 98 F (36.7 C) 97.7 F (36.5 C) 98.1 F (36.7 C) 97.8 F (36.6 C)  TempSrc: Oral Oral Oral Oral  SpO2: 99%  97% 96%  Weight:      Height:       General: cooperative and no distress Lochia: appropriate Uterine Fundus: firm Incision: N/A DVT Evaluation: No evidence of DVT seen on physical exam. Negative Homan's  sign. Labs: Lab Results  Component Value Date   WBC 18.5 (H) 09/19/2020   HGB 9.8 (L) 09/19/2020   HCT 30.1 (L) 09/19/2020   MCV 78.2 (L) 09/19/2020   PLT 517 (H) 09/19/2020   CMP Latest Ref Rng & Units 04/04/2020  Glucose 70 - 99 mg/dL 91  BUN 6 - 20 mg/dL 11  Creatinine 0.44 - 1.00 mg/dL 0.41(L)  Sodium 135 - 145 mmol/L 133(L)  Potassium 3.5 - 5.1 mmol/L 3.7  Chloride 98 - 111 mmol/L 102  CO2 22 - 32 mmol/L 22  Calcium 8.9 - 10.3 mg/dL 9.1  Total Protein 6.5 - 8.1 g/dL 7.5  Total Bilirubin 0.3 - 1.2 mg/dL 0.4  Alkaline Phos 38 - 126 U/L 45  AST 15 - 41 U/L 34  ALT 0 - 44 U/L 26   Edinburgh Score: Edinburgh Postnatal Depression Scale Screening Tool 09/18/2020  I have been able to laugh and see the funny side of things. 0  I have looked forward with enjoyment to things. 0  I have blamed myself unnecessarily when things went wrong. 0  I have been anxious or worried for no good reason. 0  I have felt scared or panicky for no good reason. 0  Things have been getting on top of me. 0  I have been so unhappy that I have had difficulty sleeping. 0  I have felt  sad or miserable. 0  I have been so unhappy that I have been crying. 0  The thought of harming myself has occurred to me. 0  Edinburgh Postnatal Depression Scale Total 0      After visit meds:  Allergies as of 09/19/2020       Reactions   Metronidazole Nausea And Vomiting   Prednisone Other (See Comments)   irritable/ angry and very lethargic when finishes        Medication List     STOP taking these medications    metoCLOPramide 10 MG tablet Commonly known as: REGLAN   ondansetron 4 MG disintegrating tablet Commonly known as: ZOFRAN-ODT   promethazine 25 MG suppository Commonly known as: PHENERGAN       TAKE these medications    Adderall XR 30 MG 24 hr capsule Generic drug: amphetamine-dextroamphetamine Take 30 mg by mouth 2 (two) times daily.   amoxicillin-clavulanate 875-125 MG tablet Commonly  known as: AUGMENTIN SMARTSIG:1 Tablet(s) By Mouth Every 12 Hours   buprenorphine 8 MG Subl SL tablet Commonly known as: SUBUTEX Place 8 mg under the tongue 2 (two) times daily.   buPROPion 150 MG 12 hr tablet Commonly known as: WELLBUTRIN SR Take 150 mg by mouth every morning.   escitalopram 20 MG tablet Commonly known as: LEXAPRO Take 20 mg by mouth daily.   ibuprofen 600 MG tablet Commonly known as: ADVIL Take 1 tablet (600 mg total) by mouth every 6 (six) hours.   traZODone 100 MG tablet Commonly known as: DESYREL Take 200 mg by mouth at bedtime as needed.         Discharge home in stable condition Infant Feeding: Bottle Infant Disposition:NICU Discharge instruction: per After Visit Summary and Postpartum booklet. Activity: Advance as tolerated. Pelvic rest for 6 weeks.  Diet: routine diet Anticipated Birth Control: Plans Interval BTL Postpartum Appointment:2 weeks Additional Postpartum F/U: Postpartum Depression checkup Future Appointments: Future Appointments  Date Time Provider [place]  09/23/2020  2:10 PM Rod Can, CNM [facility] None  10/07/2020  9:30 AM [facility] NURSE [facility]  10/07/2020  9:45 AM [facility] US4 [facility] [place]   Follow up Visit:  Follow-up Information     Rod Can, CNM Follow up in 2 week(s).   Specialty: Obstetrics Why: Please make an appointment for follow up in 2 weeks at [facility].  At that appointment we will discuss scheduling a tubal ligation (having your tubes tied) Contact information: [address] [place] [address] [phone]                     09/19/2020 Imagene Riches, CNM

## 2020-09-19 NOTE — Progress Notes (Signed)
Discharge instructions, prescriptions, education, and appointments given and explained. Pt verbalized understanding with no further questions. Pt wants to stay to eat dinner and see infant in NICU before discharging. Pt will come see RN when ready to be discharged to personal vehicle.

## 2020-09-19 NOTE — Lactation Note (Addendum)
This note was copied from a baby's chart. Lactation Consultation Note  Patient Name: Cassandra [name] SWNIO'E Date: 09/19/2020   Age:35 hours  LC spoke with mom about her feeding preferences. Mom states that she was told that providing some breast milk may help baby through withdrawal symptoms.  Mom has 3 other children and has not breastfed or provided expressed breastmilk to them, however is considering it this time.  LC reviewed current medication list. All medications are either L2 or L3 classifications with side effects of: sedation, irritability and poor feeding of infant, but overall "probably compatible".  LC reviewed with mom steps of stimulation through pumping, the need for pumping consistency to aid in bringing in a milk supply for baby, and provided reassurance that her preferences/feeding plan overall would be supported. Mom agreeable to pump set-up, education, and use at this time. Plans to follow-up with [facility] this week for pump obtainment through them, in the mean time a pump will be provided through [facility] for 1 week.   1330: Mom called LC to let her know she was back in her room. LC in room to assist with pump set-up, education, cleaning. Present for first pumping session, suction placed on level 2, mom still unsure if she can do this long term, reassurance given that her wishes would be followed and be supported. Encouraged mediation while pumping, watching her favorite show, or listening to her favorite music to help distract her while pumping. Mom called out at 1350 to let [facility] know that she completed her pumping session.   Cassandra [name] 09/19/2020, 11:10 AM

## 2020-09-19 NOTE — TOC Initial Note (Addendum)
Transition of Care [facility]) - Initial/Assessment Note    Patient Details  Name: Cassandra [name] MRN: [id number] Date of Birth: Mar 22, 1985  Transition of Care [facility] CM/SW Contact:    Anselm Pancoast, RN Phone Number: 09/19/2020, 10:31 AM  Clinical Narrative:                 Met with patient at bedside. Patient tearful during conversation. Reports she has active CPS case with other 3 children currently in placement. Reports she is still very upset because her mother made many false allegations to DSS resulting in her losing her kids. States she is active with Subutex clinic in [place] and sees a therapist at that clinic. Reports she does 4 hours of meditation daily and tries to focus on her self care. States she has been clean of drugs/alcohol for 4 years. Works as a Transport planner. Lives with the FOB who also works and is a strong support system for her when needed. Discussed PPD and resources if needed. MOB is hopeful to breastfeed due to Subutex clinic recommendation to assist with withdrawals however waiting for [facility] assistance at hospital. Reports no issues with transportation, active with [facility] services and has car seat and needed equipment for infant. Bedside RN confirmed DSS/CPS had been contacted already to update on delivery. Infant remains in SCN. No other needs or concerns at this time.         Patient Goals and CMS Choice        Expected Discharge Plan and Services           Expected Discharge Date: 09/19/20                                    Prior Living Arrangements/Services                       Activities of Daily Living Home Assistive Devices/Equipment: None ADL Screening (condition at time of admission) Patient's cognitive ability adequate to safely complete daily activities?: Yes Is the patient deaf or have difficulty hearing?: No Does the patient have difficulty seeing, even when wearing glasses/contacts?: No Does the patient have difficulty  concentrating, remembering, or making decisions?: No Patient able to express need for assistance with ADLs?: Yes Does the patient have difficulty dressing or bathing?: No Independently performs ADLs?: Yes (appropriate for developmental age) Does the patient have difficulty walking or climbing stairs?: No Weakness of Legs: None Weakness of Arms/Hands: None  Permission Sought/Granted                  Emotional Assessment              Admission diagnosis:  Vaginal delivery [O80] Postpartum care following vaginal delivery [Z39.2] Patient Active Problem List   Diagnosis Date Noted   Substance abuse affecting pregnancy in third trimester, antepartum 09/19/2020   Vaginal delivery 09/18/2020   Postpartum care following vaginal delivery 09/18/2020   Supervision of high risk pregnancy in third trimester    [redacted] weeks gestation of pregnancy    Encounter for postpartum care after unplanned out of hospital delivery    Supervision of high risk pregnancy, antepartum 09/13/2020   Late prenatal care in third trimester 09/13/2020   No-show for appointment 01/14/2020   ADD (attention deficit disorder) 07/30/2016   Anxiety and depression 07/30/2016   Hypothyroidism 07/30/2016   Polysubstance dependence ([place]) 07/30/2016   Migraine  07/30/2016   PCP:  Patient, No Pcp Per (Inactive) Pharmacy:   [id number] [place] [place] [id number]: [phone]: [phone]    Social Determinants of Health (SDOH) Interventions    Readmission Risk Interventions No flowsheet data found.

## 2020-09-19 NOTE — Progress Notes (Signed)
Pt and pt S/O ambulated to personal vehicle for d/c

## 2020-09-19 NOTE — Progress Notes (Signed)
Spoke to [employer] Social Worker with DSS. Kayla asked to speak to NICU RN to discuss infant status.

## 2020-09-21 LAB — SURGICAL PATHOLOGY

## 2020-09-23 ENCOUNTER — Encounter: Payer: Medicaid Other | Admitting: Advanced Practice Midwife

## 2020-09-30 ENCOUNTER — Observation Stay
Admission: EM | Admit: 2020-09-30 | Discharge: 2020-10-01 | Disposition: A | Discharge status: Home / Self Care | Payer: Medicaid Other | Attending: Obstetrics and Gynecology | Admitting: Obstetrics and Gynecology

## 2020-09-30 ENCOUNTER — Telehealth: Payer: Self-pay

## 2020-09-30 ENCOUNTER — Emergency Department: Payer: Medicaid Other

## 2020-09-30 DIAGNOSIS — B9620 Unspecified Escherichia coli [E. coli] as the cause of diseases classified elsewhere: Secondary | ICD-10-CM | POA: Diagnosis not present

## 2020-09-30 DIAGNOSIS — N390 Urinary tract infection, site not specified: Secondary | ICD-10-CM | POA: Diagnosis not present

## 2020-09-30 DIAGNOSIS — Z20822 Contact with and (suspected) exposure to covid-19: Secondary | ICD-10-CM | POA: Insufficient documentation

## 2020-09-30 DIAGNOSIS — J45909 Unspecified asthma, uncomplicated: Secondary | ICD-10-CM | POA: Diagnosis not present

## 2020-09-30 LAB — URINALYSIS, COMPLETE (UACMP) WITH MICROSCOPIC
Bilirubin Urine: NEGATIVE
Glucose, UA: NEGATIVE mg/dL
Hgb urine dipstick: NEGATIVE
Ketones, ur: NEGATIVE mg/dL
Leukocytes,Ua: NEGATIVE
Nitrite: POSITIVE — AB
Protein, ur: NEGATIVE mg/dL
Specific Gravity, Urine: 1.009 (ref 1.005–1.030)
Squamous Epithelial / HPF: NONE SEEN (ref 0–5)
pH: 8 (ref 5.0–8.0)

## 2020-09-30 LAB — URINE DRUG SCREEN, QUALITATIVE (ARMC ONLY)
Amphetamines, Ur Screen: POSITIVE — AB
Barbiturates, Ur Screen: NOT DETECTED
Benzodiazepine, Ur Scrn: NOT DETECTED
Cannabinoid 50 Ng, Ur ~~LOC~~: NOT DETECTED
Cocaine Metabolite,Ur ~~LOC~~: NOT DETECTED
MDMA (Ecstasy)Ur Screen: NOT DETECTED
Methadone Scn, Ur: NOT DETECTED
Opiate, Ur Screen: NOT DETECTED
Phencyclidine (PCP) Ur S: NOT DETECTED
Tricyclic, Ur Screen: NOT DETECTED

## 2020-09-30 LAB — COMPREHENSIVE METABOLIC PANEL
ALT: 21 U/L (ref 0–44)
AST: 28 U/L (ref 15–41)
Albumin: 3.6 g/dL (ref 3.5–5.0)
Alkaline Phosphatase: 127 U/L — ABNORMAL HIGH (ref 38–126)
Anion gap: 10 (ref 5–15)
BUN: 11 mg/dL (ref 6–20)
CO2: 28 mmol/L (ref 22–32)
Calcium: 8.7 mg/dL — ABNORMAL LOW (ref 8.9–10.3)
Chloride: 97 mmol/L — ABNORMAL LOW (ref 98–111)
Creatinine, Ser: 0.45 mg/dL (ref 0.44–1.00)
GFR, Estimated: >60 mL/min (ref >60)
Glucose, Bld: 89 mg/dL (ref 70–99)
Potassium: 3.5 mmol/L (ref 3.5–5.1)
Sodium: 135 mmol/L (ref 135–145)
Total Bilirubin: 0.5 mg/dL (ref 0.3–1.2)
Total Protein: 7.4 g/dL (ref 6.5–8.1)

## 2020-09-30 LAB — PROTIME-INR
INR: 0.9 (ref 0.8–1.2)
Prothrombin Time: 12.3 seconds (ref 11.4–15.2)

## 2020-09-30 LAB — CBC WITH DIFFERENTIAL/PLATELET
Abs Immature Granulocytes: 0.02 10*3/uL (ref 0.00–0.07)
Basophils Absolute: 0.2 10*3/uL — ABNORMAL HIGH (ref 0.0–0.1)
Basophils Relative: 3 %
Eosinophils Absolute: 0.5 10*3/uL (ref 0.0–0.5)
Eosinophils Relative: 7 %
HCT: 36.5 % (ref 36.0–46.0)
Hemoglobin: 11.8 g/dL — ABNORMAL LOW (ref 12.0–15.0)
Immature Granulocytes: 0 %
Lymphocytes Relative: 26 %
Lymphs Abs: 1.6 10*3/uL (ref 0.7–4.0)
MCH: 25.2 pg — ABNORMAL LOW (ref 26.0–34.0)
MCHC: 32.3 g/dL (ref 30.0–36.0)
MCV: 77.8 fL — ABNORMAL LOW (ref 80.0–100.0)
Monocytes Absolute: 0.5 10*3/uL (ref 0.1–1.0)
Monocytes Relative: 7 %
Neutro Abs: 3.6 10*3/uL (ref 1.7–7.7)
Neutrophils Relative %: 57 %
Platelets: 641 10*3/uL — ABNORMAL HIGH (ref 150–400)
RBC: 4.69 MIL/uL (ref 3.87–5.11)
RDW: 16.1 % — ABNORMAL HIGH (ref 11.5–15.5)
WBC: 6.4 10*3/uL (ref 4.0–10.5)
nRBC: 0 % (ref 0.0–0.2)

## 2020-09-30 LAB — TYPE AND SCREEN
ABO/RH(D): O POS
Antibody Screen: NEGATIVE

## 2020-09-30 LAB — RESP PANEL BY RT-PCR (FLU A&B, COVID) ARPGX2
Influenza A by PCR: NEGATIVE
Influenza B by PCR: NEGATIVE
SARS Coronavirus 2 by RT PCR: NEGATIVE

## 2020-09-30 LAB — HCG, QUANTITATIVE, PREGNANCY: hCG, Beta Chain, Quant, S: 48 m[IU]/mL — ABNORMAL HIGH (ref <5)

## 2020-09-30 LAB — PROTEIN / CREATININE RATIO, URINE
Creatinine, Urine: 30 mg/dL
Total Protein, Urine: <6 mg/dL

## 2020-09-30 MED ORDER — DOCUSATE SODIUM 100 MG PO CAPS
100.0000 mg | ORAL_CAPSULE | Freq: Two times a day (BID) | ORAL | Status: DC
  Administered 2020-10-01 (×2): 100 mg via ORAL
  Filled 2020-09-30 (×2): qty 1

## 2020-09-30 MED ORDER — LACTATED RINGERS IV SOLN: INTRAVENOUS | Status: DC

## 2020-09-30 MED ORDER — SODIUM CHLORIDE 0.9 % IV BOLUS
1000.0000 mL | Freq: Once | INTRAVENOUS | Status: AC
  Administered 2020-09-30: 1000 mL via INTRAVENOUS

## 2020-09-30 MED ORDER — METHYLERGONOVINE MALEATE 0.2 MG PO TABS
0.2000 mg | ORAL_TABLET | Freq: Three times a day (TID) | ORAL | Status: DC
  Administered 2020-10-01 (×2): 0.2 mg via ORAL
  Filled 2020-09-30 (×4): qty 1

## 2020-09-30 MED ORDER — SIMETHICONE 80 MG PO CHEW
80.0000 mg | CHEWABLE_TABLET | Freq: Four times a day (QID) | ORAL | Status: DC
  Administered 2020-10-01 (×2): 80 mg via ORAL
  Filled 2020-09-30 (×5): qty 1

## 2020-09-30 MED ORDER — NITROFURANTOIN MONOHYD MACRO 100 MG PO CAPS
100.0000 mg | ORAL_CAPSULE | Freq: Two times a day (BID) | ORAL | Status: DC
  Administered 2020-10-01: 100 mg via ORAL
  Filled 2020-09-30 (×3): qty 1

## 2020-09-30 MED ORDER — ONDANSETRON HCL 4 MG/2ML IJ SOLN
4.0000 mg | Freq: Four times a day (QID) | INTRAMUSCULAR | Status: DC | PRN
  Filled 2020-09-30: qty 2

## 2020-09-30 MED ORDER — AMPHETAMINE-DEXTROAMPHET ER 30 MG PO CP24
30.0000 mg | ORAL_CAPSULE | Freq: Two times a day (BID) | ORAL | Status: DC
  Administered 2020-10-01: 30 mg via ORAL
  Filled 2020-09-30: qty 1

## 2020-09-30 MED ORDER — CARBOPROST TROMETHAMINE 250 MCG/ML IM SOLN
250.0000 ug | Freq: Once | INTRAMUSCULAR | Status: AC
  Administered 2020-09-30: 250 ug via INTRAMUSCULAR
  Filled 2020-09-30: qty 1

## 2020-09-30 MED ORDER — BUPROPION HCL ER (SR) 150 MG PO TB12
150.0000 mg | ORAL_TABLET | Freq: Every morning | ORAL | Status: DC
  Filled 2020-09-30 (×2): qty 1

## 2020-09-30 MED ORDER — ESCITALOPRAM OXALATE 10 MG PO TABS
20.0000 mg | ORAL_TABLET | Freq: Every day | ORAL | Status: DC
  Administered 2020-10-01: 20 mg via ORAL
  Filled 2020-09-30: qty 2

## 2020-09-30 MED ORDER — TRANEXAMIC ACID-NACL 1000-0.7 MG/100ML-% IV SOLN
1000.0000 mg | Freq: Once | INTRAVENOUS | Status: AC
  Administered 2020-09-30: 1000 mg via INTRAVENOUS
  Filled 2020-09-30: qty 100

## 2020-09-30 MED ORDER — BUPRENORPHINE HCL 8 MG SL SUBL
8.0000 mg | SUBLINGUAL_TABLET | Freq: Two times a day (BID) | SUBLINGUAL | Status: DC
  Administered 2020-10-01: 8 mg via SUBLINGUAL
  Filled 2020-09-30: qty 1

## 2020-09-30 NOTE — H&P (Addendum)
Cassandra [name] is an 35 y.o. female.   Chief Complaint: Uterine bleeding HPI: Patient presented today to the emergency room from home.  She is 12 days postpartum from a vaginal delivery.  Her vaginal delivery occurred at home and she was brought to the hospital shortly afterwards by EMS.  She reports that since her discharge from the hospital she has continued to see an improvement in her bleeding pattern.  She reports that her bleeding pattern had almost completely resolved when today she suddenly began having sudden onset heavy bleeding.  She reports that she passed large blood clots at home.  In the ER she received TXA and Hemabate.  Her bleeding has somewhat slowed but not resolved.  She is resting in bed.  She reports that she is currently feeling nauseous.  She denies any dizziness or lightheadedness at the moment.  Her vital signs have been stable.  She reports that when she was at home and had her delivery the placenta delivered spontaneously and very easily on its own.  She describes that she was squatting on her bed and the placenta fell out of her vagina.  She reports that it appeared intact.  There is no straining or pulling on the placenta for her to deliver.  Additionally she reports that since coming to the emergency room she had difficulty urinating.  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  The ER nursing staff did an in and out straight catheter which resulted in 1000 cc of urine being drained from her bladder.  Past Medical History:  Diagnosis Date   ADHD (attention deficit hyperactivity disorder)    Anxiety    Asthma    Kidney stone     Past Surgical History:  Procedure Laterality Date   ELBOW FRACTURE SURGERY     EYE MUSCLE SURGERY Bilateral    OVARIAN CYST REMOVAL Right    TONSILLECTOMY      No family history on file. Social History:  reports that she has been smoking cigarettes. She has never used smokeless tobacco. She reports that she does not currently use  alcohol. She reports that she does not currently use drugs.  Allergies:  Allergies  Allergen Reactions   Metronidazole Nausea And Vomiting   Prednisone Other (See Comments)    irritable/ angry and very lethargic when finishes    (Not in a hospital admission)   Results for orders placed or performed during the hospital encounter of 09/30/20 (from the past 48 hour(s))  Comprehensive metabolic panel     Status: Abnormal   Collection Time: 09/30/20  4:04 PM  Result Value Ref Range   Sodium 135 135 - 145 mmol/L   Potassium 3.5 3.5 - 5.1 mmol/L   Chloride 97 (L) 98 - 111 mmol/L   CO2 28 22 - 32 mmol/L   Glucose, Bld 89 70 - 99 mg/dL    Comment: Glucose reference range applies only to samples taken after fasting for at least 8 hours.   BUN 11 6 - 20 mg/dL   Creatinine, Ser 1.61 0.44 - 1.00 mg/dL   Calcium 8.7 (L) 8.9 - 10.3 mg/dL   Total Protein 7.4 6.5 - 8.1 g/dL   Albumin 3.6 3.5 - 5.0 g/dL   AST 28 15 - 41 U/L   ALT 21 0 - 44 U/L   Alkaline Phosphatase 127 (H) 38 - 126 U/L   Total Bilirubin 0.5 0.3 - 1.2 mg/dL   GFR, Estimated >09 >60 mL/min  Comment: (NOTE) Calculated using the CKD-EPI Creatinine Equation (2021)    Anion gap 10 5 - 15    Comment: Performed at [facility], [address] [place], [place] [address]  CBC with Differential     Status: Abnormal   Collection Time: 09/30/20  4:04 PM  Result Value Ref Range   WBC 6.4 4.0 - 10.5 K/uL   RBC 4.69 3.87 - 5.11 MIL/uL   Hemoglobin 11.8 (L) 12.0 - 15.0 g/dL   HCT 97.0 26.3 - 78.5 %   MCV 77.8 (L) 80.0 - 100.0 fL   MCH 25.2 (L) 26.0 - 34.0 pg   MCHC 32.3 30.0 - 36.0 g/dL   RDW 88.5 (H) 02.7 - 74.1 %   Platelets 641 (H) 150 - 400 K/uL   nRBC 0.0 0.0 - 0.2 %   Neutrophils Relative % 57 %   Neutro Abs 3.6 1.7 - 7.7 K/uL   Lymphocytes Relative 26 %   Lymphs Abs 1.6 0.7 - 4.0 K/uL   Monocytes Relative 7 %   Monocytes Absolute 0.5 0.1 - 1.0 K/uL   Eosinophils Relative 7 %   Eosinophils Absolute 0.5 0.0  - 0.5 K/uL   Basophils Relative 3 %   Basophils Absolute 0.2 (H) 0.0 - 0.1 K/uL   Immature Granulocytes 0 %   Abs Immature Granulocytes 0.02 0.00 - 0.07 K/uL    Comment: Performed at [facility], [address] [place], [place] [address]  Protime-INR     Status: None   Collection Time: 09/30/20  4:04 PM  Result Value Ref Range   Prothrombin Time 12.3 11.4 - 15.2 seconds   INR 0.9 0.8 - 1.2    Comment: (NOTE) INR goal varies based on device and disease states. Performed at [facility], [address] [place], [place] [address]   Type and screen [facility]     Status: None   Collection Time: 09/30/20  4:04 PM  Result Value Ref Range   ABO/RH(D) O POS    Antibody Screen NEG    Sample Expiration      10/03/2020,2359 Performed at [facility], [address] [place], [place] [address]   hCG, quantitative, pregnancy     Status: Abnormal   Collection Time: 09/30/20  4:35 PM  Result Value Ref Range   hCG, Beta Chain, Quant, S 48 (H) <5 mIU/mL    Comment:          GEST. AGE      CONC.  (mIU/mL)   <=1 WEEK        5 - 50     2 WEEKS       50 - 500     3 WEEKS       100 - 10,000     4 WEEKS     1,000 - 30,000     5 WEEKS     3,500 - 115,000   6-8 WEEKS     12,000 - 270,000    12 WEEKS     15,000 - 220,000        FEMALE AND NON-PREGNANT FEMALE:     LESS THAN 5 mIU/mL Performed at [facility], [address] [place], [place] [address]   Urinalysis, Complete w Microscopic Urine, Catheterized     Status: Abnormal   Collection Time: 09/30/20  9:15 PM  Result Value Ref Range   Color, Urine YELLOW (A) YELLOW   APPearance HAZY (A) CLEAR  Specific Gravity, Urine 1.009 1.005 - 1.030   pH 8.0 5.0 - 8.0   Glucose, UA NEGATIVE NEGATIVE mg/dL   Hgb urine dipstick NEGATIVE NEGATIVE   Bilirubin Urine NEGATIVE NEGATIVE   Ketones, ur NEGATIVE NEGATIVE mg/dL   Protein, ur NEGATIVE NEGATIVE mg/dL   Nitrite POSITIVE (A)  NEGATIVE   Leukocytes,Ua NEGATIVE NEGATIVE   RBC / HPF 6-10 0 - 5 RBC/hpf   WBC, UA 0-5 0 - 5 WBC/hpf   Bacteria, UA RARE (A) NONE SEEN   Squamous Epithelial / LPF NONE SEEN 0 - 5   Mucus PRESENT     Comment: Performed at [facility], [address] [place], [place] [address]  Urine Drug Screen, Qualitative     Status: Abnormal   Collection Time: 09/30/20  9:15 PM  Result Value Ref Range   Tricyclic, Ur Screen NONE DETECTED NONE DETECTED   Amphetamines, Ur Screen POSITIVE (A) NONE DETECTED   MDMA (Ecstasy)Ur Screen NONE DETECTED NONE DETECTED   Cocaine Metabolite,Ur [place] NONE DETECTED NONE DETECTED   Opiate, Ur Screen NONE DETECTED NONE DETECTED   Phencyclidine (PCP) Ur S NONE DETECTED NONE DETECTED   Cannabinoid 50 Ng, Ur [place] NONE DETECTED NONE DETECTED   Barbiturates, Ur Screen NONE DETECTED NONE DETECTED   Benzodiazepine, Ur Scrn NONE DETECTED NONE DETECTED   Methadone Scn, Ur NONE DETECTED NONE DETECTED    Comment: (NOTE) Tricyclics + metabolites, urine    Cutoff 1000 ng/mL Amphetamines + metabolites, urine  Cutoff 1000 ng/mL MDMA (Ecstasy), urine              Cutoff 500 ng/mL Cocaine Metabolite, urine          Cutoff 300 ng/mL Opiate + metabolites, urine        Cutoff 300 ng/mL Phencyclidine (PCP), urine         Cutoff 25 ng/mL Cannabinoid, urine                 Cutoff 50 ng/mL Barbiturates + metabolites, urine  Cutoff 200 ng/mL Benzodiazepine, urine              Cutoff 200 ng/mL Methadone, urine                   Cutoff 300 ng/mL  The urine drug screen provides only a preliminary, unconfirmed analytical test result and should not be used for non-medical purposes. Clinical consideration and professional judgment should be applied to any positive drug screen result due to possible interfering substances. A more specific alternate chemical method must be used in order to obtain a confirmed analytical result. Gas chromatography / mass spectrometry (GC/MS) is the  preferred confirm atory method. Performed at [facility], [address] [place], [place] [address]    US Pelvis Complete  Result Date: 09/30/2020 CLINICAL DATA:  Postpartum hemorrhage EXAM: TRANSABDOMINAL AND TRANSVAGINAL ULTRASOUND OF PELVIS TECHNIQUE: Both transabdominal and transvaginal ultrasound examinations of the pelvis were performed. Transabdominal technique was performed for global imaging of the pelvis including uterus, ovaries, adnexal regions, and pelvic cul-de-sac. It was necessary to proceed with endovaginal exam following the transabdominal exam to visualize the uterus, endometrium, ovaries, and adnexa. COMPARISON:  None FINDINGS: Uterus Measurements: 12.4 x 8.2 x 8.5 cm = volume: 452 mL. No fibroids or other mass visualized. Endometrium Thickness: 57 mm.  Thickened, heterogeneous endometrium. Right ovary Surgically absent. Left ovary Measurements: 4.3 x 1.9 x 2.0 cm = volume: 9 mL. Normal appearance/no adnexal mass. Other findings No abnormal  free fluid. IMPRESSION: Thickened, heterogeneous endometrium measuring up to 57 mm. Findings are concerning for retained products of conception. Electronically Signed   By: Lauralyn Primes M.D.   On: 09/30/2020 17:56    Review of Systems  Constitutional:  Negative for chills and fever.  HENT:  Negative for congestion, hearing loss and sinus pain.   Respiratory:  Negative for cough, shortness of breath and wheezing.   Cardiovascular:  Negative for chest pain, palpitations and leg swelling.  Gastrointestinal:  Negative for abdominal pain, constipation, diarrhea, nausea and vomiting.  Genitourinary:  Negative for dysuria, flank pain, frequency, hematuria and urgency.  Musculoskeletal:  Negative for back pain.  Skin:  Negative for rash.  Neurological:  Negative for dizziness and headaches.  Psychiatric/Behavioral:  Negative for suicidal ideas. The patient is not nervous/anxious.    Blood pressure 133/89, pulse (!) 102, resp. rate 18,  height 5\' 2"  (1.575 m), weight 45.4 kg, SpO2 98 %, not currently breastfeeding. Physical Exam Vitals and nursing note reviewed.  Constitutional:      Appearance: Normal appearance. She is well-developed.  HENT:     Head: Normocephalic and atraumatic.  Cardiovascular:     Rate and Rhythm: Normal rate and regular rhythm.  Pulmonary:     Effort: Pulmonary effort is normal.     Breath sounds: Normal breath sounds.  Abdominal:     General: Bowel sounds are normal.     Palpations: Abdomen is soft.  Musculoskeletal:        General: Normal range of motion.  Skin:    General: Skin is warm and dry.  Neurological:     Mental Status: She is alert and oriented to person, place, and time.  Psychiatric:        Behavior: Behavior normal.        Thought Content: Thought content normal.        Judgment: Judgment normal.     Assessment/Plan 35 year old with delayed/secondary postpartum hemorrhage We discussed that causes of secondary postpartum hemorrhage can include retained placenta as well as subinvolution of the placental site.  Patient's story seems to be consistent with an easy delivery of the placenta and not especially suggestive of risk factors for retained placental tissue.  I offered her a dilation and curettage to evaluate for any retained tissue.  She declined surgery at this time; she stated that she would like to avoid surgery if possible.  We will plan to observe her overnight and repeat her CBC in the morning time.  Will continue IV fluids for hydration and volume replacement.  Will give oral Methergine 3 times daily.  Patient has a history of substance abuse and is currently using Subutex.  UDS today.  Patient's home medications of Subutex, Adderall, Wellbutrin and Lexapro reordered.  Zofran as needed nausea  N.p.o. overnight.  If doing well in the morning can advance diet.  Urinary retention- timed voids every 3 hours and Strict I&Os to monitor output.  Urinalysis is suggestive  of UTI and will start patient on Macrobid.  31, MD 09/30/2020, 9:47 PM

## 2020-09-30 NOTE — ED Triage Notes (Addendum)
Pt c/o vaginal bleeding and passing clots starting this morning.  Pt delivered x 12 days ago.  Denies plan.    Pt reports clots were small earlier.  Pt passed moderate sized clot during triage and Pt actively bleeding.   Pt reports no complications w/ delivery.

## 2020-09-30 NOTE — Telephone Encounter (Signed)
Pt calling; delivered 1 1/2 wks ago; bleeding started to stop and now has p/u today for some some reason.  [phone]  Pt is currently adm to [facility] ED; left msg that is where she needs to be if bleeding heavily.

## 2020-09-30 NOTE — ED Notes (Signed)
Request made for transport to the floor ?

## 2020-09-30 NOTE — ED Provider Notes (Signed)
[facility] Emergency Department Provider Note   ____________________________________________   Event Date/Time   First MD Initiated Contact with Patient 09/30/20 1517     (approximate)  I have reviewed the triage vital signs and the nursing notes.   HISTORY  Chief Complaint Postpartum Complications    HPI Cassandra [name] is a 35 y.o. female who presents for vaginal bleeding in the setting of recent and's pregnancy and delivery 10 days prior to arrival  LOCATION: Vagina DURATION: Began this morning TIMING: Worsening since onset SEVERITY: Moderate QUALITY: Bleeding CONTEXT: Patient states that she delivered at home approximately 10 days prior to arrival and has had decreased bleeding until this morning when she had significantly increased passage of large clots and presented to the emergency department. MODIFYING FACTORS: Denies any exacerbating or relieving factors. ASSOCIATED SYMPTOMS: Denies   Per medical record review, patient has history of ADHD, anxiety, and substance abuse          Past Medical History:  Diagnosis Date   ADHD (attention deficit hyperactivity disorder)    Anxiety    Asthma    Kidney stone     Patient Active Problem List   Diagnosis Date Noted   Substance abuse affecting pregnancy in third trimester, antepartum 09/19/2020   Vaginal delivery 09/18/2020   Postpartum care following vaginal delivery 09/18/2020   Supervision of high risk pregnancy in third trimester    [redacted] weeks gestation of pregnancy    Encounter for postpartum care after unplanned out of hospital delivery    Supervision of high risk pregnancy, antepartum 09/13/2020   Late prenatal care in third trimester 09/13/2020   No-show for appointment 01/14/2020   ADD (attention deficit disorder) 07/30/2016   Anxiety and depression 07/30/2016   Hypothyroidism 07/30/2016   Polysubstance dependence (HCC) 07/30/2016   Migraine 07/30/2016    Past Surgical  History:  Procedure Laterality Date   ELBOW FRACTURE SURGERY     EYE MUSCLE SURGERY Bilateral    OVARIAN CYST REMOVAL Right    TONSILLECTOMY      Prior to Admission medications   Medication Sig Start Date End Date Taking? Authorizing Provider  ADDERALL XR 30 MG 24 hr capsule Take 30 mg by mouth 2 (two) times daily. 09/01/20   [provider]  buprenorphine (SUBUTEX) 8 MG SUBL SL tablet Place 8 mg under the tongue 2 (two) times daily. 09/09/20   [provider]  buPROPion (WELLBUTRIN SR) 150 MG 12 hr tablet Take 150 mg by mouth every morning. 08/21/20   [provider]  escitalopram (LEXAPRO) 20 MG tablet Take 20 mg by mouth daily. 09/01/20   [provider]  ibuprofen (ADVIL) 600 MG tablet Take 1 tablet (600 mg total) by mouth every 6 (six) hours. 09/19/20   Mirna Mires, CNM  traZODone (DESYREL) 100 MG tablet Take 200 mg by mouth at bedtime as needed. 09/13/20   [provider]    Allergies Metronidazole and Prednisone  No family history on file.  Social History Social History   Tobacco Use   Smoking status: Every Day    Types: Cigarettes   Smokeless tobacco: Never  Vaping Use   Vaping Use: Never used  Substance Use Topics   Alcohol use: Not Currently   Drug use: Not Currently    Review of Systems Constitutional: No fever/chills Eyes: No visual changes. ENT: No sore throat. Cardiovascular: Denies chest pain. Respiratory: Denies shortness of breath. Gastrointestinal: No abdominal pain.  No nausea, no vomiting.  No diarrhea. Genitourinary: Endorses vaginal bleeding. negative for dysuria. Musculoskeletal: Negative for acute arthralgias Skin: Negative for rash. Neurological: Negative for headaches, weakness/numbness/paresthesias in any extremity Psychiatric: Negative for suicidal ideation/homicidal ideation   ____________________________________________   PHYSICAL EXAM:  VITAL SIGNS: ED Triage Vitals  Enc Vitals Group      BP 09/30/20 1520 (!) 151/100     Pulse Rate 09/30/20 1520 (!) 112     Resp 09/30/20 1520 20     Temp --      Temp Source 09/30/20 1520 Oral     SpO2 09/30/20 1520 97 %     Weight 09/30/20 1521 100 lb (45.4 kg)     Height 09/30/20 1521 5\' 2"  (1.575 m)     Head Circumference --      Peak Flow --      Pain Score 09/30/20 1521 0     Pain Loc --      Pain Edu? --      Excl. in GC? --    Constitutional: Alert and oriented. Well appearing and in no acute distress. Eyes: Conjunctivae are normal. PERRL. Head: Atraumatic. Nose: No congestion/rhinnorhea. Mouth/Throat: Mucous membranes are moist. Neck: No stridor Cardiovascular: Grossly normal heart sounds.  Good peripheral circulation. Respiratory: Normal respiratory effort.  No retractions. Pelvic exam: No external lesions or signs of trauma.  Vaginal vault without any obvious vaginal tears or bleeding.  Small amount of dark red blood oozing from the cervical os with pooling in the cul-de-sac and multiple clots.  No foul odor or purulent discharge Gastrointestinal: Soft and nontender. No distention. Musculoskeletal: No obvious deformities Neurologic:  Normal speech and language. No gross focal neurologic deficits are appreciated. Skin:  Skin is warm and dry. No rash noted. Psychiatric: Mood and affect are normal. Speech and behavior are normal.  ____________________________________________   LABS (all labs ordered are listed, but only abnormal results are displayed)  Labs Reviewed  COMPREHENSIVE METABOLIC PANEL - Abnormal; Notable for the following components:      Result Value   Chloride 97 (*)    Calcium 8.7 (*)    Alkaline Phosphatase 127 (*)    All other components within normal limits  CBC WITH DIFFERENTIAL/PLATELET - Abnormal; Notable for the following components:   Hemoglobin 11.8 (*)    MCV 77.8 (*)    MCH 25.2 (*)    RDW 16.1 (*)    Platelets 641 (*)    Basophils Absolute 0.2 (*)    All other components within normal  limits  HCG, QUANTITATIVE, PREGNANCY - Abnormal; Notable for the following components:   hCG, Beta Chain, Quant, S 48 (*)    All other components within normal limits  URINALYSIS, COMPLETE (UACMP) WITH MICROSCOPIC - Abnormal; Notable for the following components:   Color, Urine YELLOW (*)    APPearance HAZY (*)    Nitrite POSITIVE (*)    Bacteria, UA RARE (*)    All other components within normal limits  RESP PANEL BY RT-PCR (FLU A&B, COVID) ARPGX2  PROTIME-INR  PROTEIN / CREATININE RATIO, URINE  URINE DRUG SCREEN, QUALITATIVE ([facility] ONLY)  TYPE AND SCREEN   RADIOLOGY  ED MD interpretation: Transvaginal and transabdominal ultrasound of the pelvis was performed and noted thickened, heterogeneous endometrium concerning for retained products of conception  Official radiology report(s): 10/02/20 Pelvis Complete  Result Date: 09/30/2020 CLINICAL DATA:  Postpartum hemorrhage EXAM: TRANSABDOMINAL AND TRANSVAGINAL ULTRASOUND OF PELVIS TECHNIQUE: Both transabdominal and transvaginal ultrasound examinations of the pelvis were performed. Transabdominal technique  was performed for global imaging of the pelvis including uterus, ovaries, adnexal regions, and pelvic cul-de-sac. It was necessary to proceed with endovaginal exam following the transabdominal exam to visualize the uterus, endometrium, ovaries, and adnexa. COMPARISON:  None FINDINGS: Uterus Measurements: 12.4 x 8.2 x 8.5 cm = volume: 452 mL. No fibroids or other mass visualized. Endometrium Thickness: 57 mm.  Thickened, heterogeneous endometrium. Right ovary Surgically absent. Left ovary Measurements: 4.3 x 1.9 x 2.0 cm = volume: 9 mL. Normal appearance/no adnexal mass. Other findings No abnormal free fluid. IMPRESSION: Thickened, heterogeneous endometrium measuring up to 57 mm. Findings are concerning for retained products of conception. Electronically Signed   By: Lauralyn Primes M.D.   On: 09/30/2020 17:56     ____________________________________________   PROCEDURES  Procedure(s) performed (including Critical Care):  .1-3 Lead EKG Interpretation Performed by: Merwyn Katos, MD Authorized by: Merwyn Katos, MD     Interpretation: abnormal     ECG rate:  105   ECG rate assessment: tachycardic     Rhythm: sinus tachycardia     Ectopy: none     Conduction: normal    CRITICAL CARE Performed by: Merwyn Katos   Total critical care time: 53 minutes  Critical care time was exclusive of separately billable procedures and treating other patients.  Critical care was necessary to treat or prevent imminent or life-threatening deterioration.  Critical care was time spent personally by me on the following activities: development of treatment plan with patient and/or surrogate as well as nursing, discussions with consultants, evaluation of patient's response to treatment, examination of patient, obtaining history from patient or surrogate, ordering and performing treatments and interventions, ordering and review of laboratory studies, ordering and review of radiographic studies, pulse oximetry and re-evaluation of patient's condition.  ____________________________________________   INITIAL IMPRESSION / ASSESSMENT AND PLAN / ED COURSE  As part of my medical decision making, I reviewed the following data within the electronic medical record, if available:  Nursing notes reviewed and incorporated, Labs reviewed, EKG interpreted, Old chart reviewed, Radiograph reviewed and Notes from prior ED visits reviewed and incorporated     This patient presents with 1 days vaginal bleeding most likely of nonemergent etiology.  ED Workup: CBC, BMP, UA, bHCG, Type&Screen  Based on History, Exam, and ED Workup patient's presentation not consistent with ectopic pregnancy, molar pregnancy, life-threatening coagulopathy, trauma, serious bacterial infection, central process or other emergency. Tx: IV  fluids TXA 1 g IV Carboprost Pelvic ultrasound concerning for possible retained products of conception. Consult: I spoke to the on-call OB/GYN physician from [facility], Dr. Jerene Pitch, who generously discussed this patient's care with me at length and recommended TXA, Hemabate, and Methergine as needed.  After assessing patient's as well as reviewing the ultrasound, Dr. Jerene Pitch offered a D&C for the patient however she did not feel comfortable going to surgery at this time and therefore patient will be admitted to the observation unit for further evaluation and management. Disposition: Admit for observation     ____________________________________________   FINAL CLINICAL IMPRESSION(S) / ED DIAGNOSES  Final diagnoses:  Delayed postpartum hemorrhage     ED Discharge Orders     None        Note:  This document was prepared using Dragon voice recognition software and may include unintentional dictation errors.    Merwyn Katos, MD 09/30/20 2137

## 2020-09-30 NOTE — ED Notes (Signed)
Pt back from [place]. Pt passing large blood clots. Pt cleaned and purewick placed on pt. Pt placed back on monitor. Pt denies any further needs at this time.

## 2020-10-01 LAB — CBC
HCT: 27.2 % — ABNORMAL LOW (ref 36.0–46.0)
Hemoglobin: 8.8 g/dL — ABNORMAL LOW (ref 12.0–15.0)
MCH: 25.2 pg — ABNORMAL LOW (ref 26.0–34.0)
MCHC: 32.4 g/dL (ref 30.0–36.0)
MCV: 77.9 fL — ABNORMAL LOW (ref 80.0–100.0)
Platelets: 464 10*3/uL — ABNORMAL HIGH (ref 150–400)
RBC: 3.49 MIL/uL — ABNORMAL LOW (ref 3.87–5.11)
RDW: 16.1 % — ABNORMAL HIGH (ref 11.5–15.5)
WBC: 11.3 10*3/uL — ABNORMAL HIGH (ref 4.0–10.5)
nRBC: 0 % (ref 0.0–0.2)

## 2020-10-01 MED ORDER — METHYLERGONOVINE MALEATE 0.2 MG PO TABS: 0.2000 mg | ORAL_TABLET | Freq: Three times a day (TID) | ORAL | 0 refills | Status: DC

## 2020-10-01 MED ORDER — NITROFURANTOIN MONOHYD MACRO 100 MG PO CAPS: 100.0000 mg | ORAL_CAPSULE | Freq: Two times a day (BID) | ORAL | 0 refills | Status: DC

## 2020-10-01 MED ORDER — FERROUS SULFATE 325 (65 FE) MG PO TABS
325.0000 mg | ORAL_TABLET | Freq: Every day | ORAL | Status: DC
  Administered 2020-10-01: 325 mg via ORAL
  Filled 2020-10-01: qty 1

## 2020-10-01 NOTE — Progress Notes (Signed)
Pt educated on discharge instructions, medications and follow up appointments. Patient verbalized understanding.

## 2020-10-01 NOTE — Discharge Summary (Signed)
Physician Discharge Summary  Patient ID: Cassandra [name] MRN: [id number] DOB/AGE: 35-Sep-1987 35 y.o.  Admit date: 09/30/2020 Discharge date: 10/01/2020  Admission Diagnoses:Principal Problem:   Excessive postpartum bleeding  Discharge Diagnoses:  Principal Problem:   Excessive postpartum bleeding  Discharged Condition: good  Hospital Course: Pt seen and examines,labs and ultrasound.  Treated for post partum hemorrhage, meds and observation.  No surgery required.  Hemoglobin dropped but stable and sx's of severe anemia.  Plan Methergine for bleeding as uterus recovers.  Plan Macrobid for UTI.  Follow up as arranged.  Instructions to return for break thru heavy bleeding.  Consults: None  Significant Diagnostic Studies: labs:  Results for orders placed or performed during the hospital encounter of 09/30/20  Resp Panel by RT-PCR (Flu A&B, Covid) Nasopharyngeal Swab   Specimen: Nasopharyngeal Swab; Nasopharyngeal(NP) swabs in vial transport medium  Result Value Ref Range   SARS Coronavirus 2 by RT PCR NEGATIVE NEGATIVE   Influenza A by PCR NEGATIVE NEGATIVE   Influenza B by PCR NEGATIVE NEGATIVE  Comprehensive metabolic panel  Result Value Ref Range   Sodium 135 135 - 145 mmol/L   Potassium 3.5 3.5 - 5.1 mmol/L   Chloride 97 (L) 98 - 111 mmol/L   CO2 28 22 - 32 mmol/L   Glucose, Bld 89 70 - 99 mg/dL   BUN 11 6 - 20 mg/dL   Creatinine, Ser 4.09 0.44 - 1.00 mg/dL   Calcium 8.7 (L) 8.9 - 10.3 mg/dL   Total Protein 7.4 6.5 - 8.1 g/dL   Albumin 3.6 3.5 - 5.0 g/dL   AST 28 15 - 41 U/L   ALT 21 0 - 44 U/L   Alkaline Phosphatase 127 (H) 38 - 126 U/L   Total Bilirubin 0.5 0.3 - 1.2 mg/dL   GFR, Estimated >81 >19 mL/min   Anion gap 10 5 - 15  CBC with Differential  Result Value Ref Range   WBC 6.4 4.0 - 10.5 K/uL   RBC 4.69 3.87 - 5.11 MIL/uL   Hemoglobin 11.8 (L) 12.0 - 15.0 g/dL   HCT 14.7 82.9 - 56.2 %   MCV 77.8 (L) 80.0 - 100.0 fL   MCH 25.2 (L) 26.0 - 34.0 pg   MCHC 32.3 30.0 -  36.0 g/dL   RDW 13.0 (H) 86.5 - 78.4 %   Platelets 641 (H) 150 - 400 K/uL   nRBC 0.0 0.0 - 0.2 %   Neutrophils Relative % 57 %   Neutro Abs 3.6 1.7 - 7.7 K/uL   Lymphocytes Relative 26 %   Lymphs Abs 1.6 0.7 - 4.0 K/uL   Monocytes Relative 7 %   Monocytes Absolute 0.5 0.1 - 1.0 K/uL   Eosinophils Relative 7 %   Eosinophils Absolute 0.5 0.0 - 0.5 K/uL   Basophils Relative 3 %   Basophils Absolute 0.2 (H) 0.0 - 0.1 K/uL   Immature Granulocytes 0 %   Abs Immature Granulocytes 0.02 0.00 - 0.07 K/uL  Protime-INR  Result Value Ref Range   Prothrombin Time 12.3 11.4 - 15.2 seconds   INR 0.9 0.8 - 1.2  Protein / creatinine ratio, urine  Result Value Ref Range   Creatinine, Urine 30 mg/dL   Total Protein, Urine <6 mg/dL   Protein Creatinine Ratio        0.00 - 0.15 mg/mg[Cre]  hCG, quantitative, pregnancy  Result Value Ref Range   hCG, Beta Chain, Quant, S 48 (H) <5 mIU/mL  Urinalysis, Complete w Microscopic Urine, Catheterized  Result Value Ref Range   Color, Urine YELLOW (A) YELLOW   APPearance HAZY (A) CLEAR   Specific Gravity, Urine 1.009 1.005 - 1.030   pH 8.0 5.0 - 8.0   Glucose, UA NEGATIVE NEGATIVE mg/dL   Hgb urine dipstick NEGATIVE NEGATIVE   Bilirubin Urine NEGATIVE NEGATIVE   Ketones, ur NEGATIVE NEGATIVE mg/dL   Protein, ur NEGATIVE NEGATIVE mg/dL   Nitrite POSITIVE (A) NEGATIVE   Leukocytes,Ua NEGATIVE NEGATIVE   RBC / HPF 6-10 0 - 5 RBC/hpf   WBC, UA 0-5 0 - 5 WBC/hpf   Bacteria, UA RARE (A) NONE SEEN   Squamous Epithelial / LPF NONE SEEN 0 - 5   Mucus PRESENT   Urine Drug Screen, Qualitative  Result Value Ref Range   Tricyclic, Ur Screen NONE DETECTED NONE DETECTED   Amphetamines, Ur Screen POSITIVE (A) NONE DETECTED   MDMA (Ecstasy)Ur Screen NONE DETECTED NONE DETECTED   Cocaine Metabolite,Ur [place] NONE DETECTED NONE DETECTED   Opiate, Ur Screen NONE DETECTED NONE DETECTED   Phencyclidine (PCP) Ur S NONE DETECTED NONE DETECTED   Cannabinoid 50 Ng, Ur [place] NONE  DETECTED NONE DETECTED   Barbiturates, Ur Screen NONE DETECTED NONE DETECTED   Benzodiazepine, Ur Scrn NONE DETECTED NONE DETECTED   Methadone Scn, Ur NONE DETECTED NONE DETECTED  CBC  Result Value Ref Range   WBC 11.3 (H) 4.0 - 10.5 K/uL   RBC 3.49 (L) 3.87 - 5.11 MIL/uL   Hemoglobin 8.8 (L) 12.0 - 15.0 g/dL   HCT 42.5 (L) 95.6 - 38.7 %   MCV 77.9 (L) 80.0 - 100.0 fL   MCH 25.2 (L) 26.0 - 34.0 pg   MCHC 32.4 30.0 - 36.0 g/dL   RDW 56.4 (H) 33.2 - 95.1 %   Platelets 464 (H) 150 - 400 K/uL   nRBC 0.0 0.0 - 0.2 %  Type and screen [facility]  Result Value Ref Range   ABO/RH(D) O POS    Antibody Screen NEG    Sample Expiration      10/03/2020,2359 Performed at [facility], [address] [place], [place] [address]    Treatments: IV hydration, antibiotics: Macrobid, and Methergine, Hemobate, and TXA  Discharge Exam: Blood pressure 107/68, pulse 93, temperature 99.1 F (37.3 C), temperature source Oral, resp. rate 18, height 5\' 2"  (1.575 m), weight 45.4 kg, SpO2 94 %, not currently breastfeeding. General appearance: alert, cooperative, and no distress No Change  Disposition: Discharge disposition: 01-Home or Self Care      Discharge Instructions     Call MD for:  difficulty breathing, headache or visual disturbances   Complete by: As directed    Call MD for:  severe uncontrolled pain   Complete by: As directed    Call MD for:  temperature >100.4   Complete by: As directed    Diet - low sodium heart healthy   Complete by: As directed    Increase activity slowly   Complete by: As directed    May shower / Bathe   Complete by: As directed    No wound care   Complete by: As directed    Sexual Activity Restrictions   Complete by: As directed    No sex for 6 weeks postpartum      Allergies as of 10/01/2020       Reactions   Metronidazole Nausea And Vomiting   Prednisone Other (See Comments)   irritable/ angry and very lethargic  when finishes  Medication List     TAKE these medications    Adderall XR 30 MG 24 hr capsule Generic drug: amphetamine-dextroamphetamine Take 30 mg by mouth 2 (two) times daily.   buprenorphine 8 MG Subl SL tablet Commonly known as: SUBUTEX Place 8 mg under the tongue 2 (two) times daily.   buPROPion 150 MG 12 hr tablet Commonly known as: WELLBUTRIN SR Take 150 mg by mouth every morning.   escitalopram 20 MG tablet Commonly known as: LEXAPRO Take 20 mg by mouth daily.   ibuprofen 600 MG tablet Commonly known as: ADVIL Take 1 tablet (600 mg total) by mouth every 6 (six) hours.   methylergonovine 0.2 MG tablet Commonly known as: METHERGINE Take 1 tablet (0.2 mg total) by mouth 3 (three) times daily.   nitrofurantoin (macrocrystal-monohydrate) 100 MG capsule Commonly known as: MACROBID Take 1 capsule (100 mg total) by mouth every 12 (twelve) hours.   traZODone 100 MG tablet Commonly known as: DESYREL Take 200 mg by mouth at bedtime as needed.         Signed: Letitia Libra 10/01/2020, 9:53 AM

## 2020-10-01 NOTE — Progress Notes (Signed)
Subjective:  She is resting in bed sleeping. She report her bleeding improved overnight and she is feeling better. Nursing reports she was able to void 600 cc overnight and bladder scan afterwards showed 100 cc in the bladder.   Objective:   Blood pressure 122/85, pulse (!) 111, temperature 97.9 F (36.6 C), temperature source Oral, resp. rate 18, height 5\' 2"  (1.575 m), weight 45.4 kg, SpO2 96 %, not currently breastfeeding.  General: NAD Pulmonary: no increased work of breathing Abdomen: non-distended, non-tender Uterus:  fundus firm at U; lochia normal Extremities: no edema, no erythema, no tenderness, no signs of DVT  Results for orders placed or performed during the hospital encounter of 09/30/20 (from the past 72 hour(s))  Comprehensive metabolic panel     Status: Abnormal   Collection Time: 09/30/20  4:04 PM  Result Value Ref Range   Sodium 135 135 - 145 mmol/L   Potassium 3.5 3.5 - 5.1 mmol/L   Chloride 97 (L) 98 - 111 mmol/L   CO2 28 22 - 32 mmol/L   Glucose, Bld 89 70 - 99 mg/dL    Comment: Glucose reference range applies only to samples taken after fasting for at least 8 hours.   BUN 11 6 - 20 mg/dL   Creatinine, Ser 10/02/20 0.44 - 1.00 mg/dL   Calcium 8.7 (L) 8.9 - 10.3 mg/dL   Total Protein 7.4 6.5 - 8.1 g/dL   Albumin 3.6 3.5 - 5.0 g/dL   AST 28 15 - 41 U/L   ALT 21 0 - 44 U/L   Alkaline Phosphatase 127 (H) 38 - 126 U/L   Total Bilirubin 0.5 0.3 - 1.2 mg/dL   GFR, Estimated 3.66 >44 mL/min    Comment: (NOTE) Calculated using the CKD-EPI Creatinine Equation (2021)    Anion gap 10 5 - 15    Comment: Performed at [facility], [address] [place], [place]  CBC with Differential     Status: Abnormal   Collection Time: 09/30/20  4:04 PM  Result Value Ref Range   WBC 6.4 4.0 - 10.5 K/uL   RBC 4.69 3.87 - 5.11 MIL/uL   Hemoglobin 11.8 (L) 12.0 - 15.0 g/dL   HCT 10/02/20 95.6 - 38.7 %   MCV 77.8 (L) 80.0 - 100.0 fL   MCH 25.2 (L) 26.0 - 34.0 pg    MCHC 32.3 30.0 - 36.0 g/dL   RDW 56.4 (H) 33.2 - 95.1 %   Platelets 641 (H) 150 - 400 K/uL   nRBC 0.0 0.0 - 0.2 %   Neutrophils Relative % 57 %   Neutro Abs 3.6 1.7 - 7.7 K/uL   Lymphocytes Relative 26 %   Lymphs Abs 1.6 0.7 - 4.0 K/uL   Monocytes Relative 7 %   Monocytes Absolute 0.5 0.1 - 1.0 K/uL   Eosinophils Relative 7 %   Eosinophils Absolute 0.5 0.0 - 0.5 K/uL   Basophils Relative 3 %   Basophils Absolute 0.2 (H) 0.0 - 0.1 K/uL   Immature Granulocytes 0 %   Abs Immature Granulocytes 0.02 0.00 - 0.07 K/uL    Comment: Performed at [facility], [address] [place], [place]  Protime-INR     Status: None   Collection Time: 09/30/20  4:04 PM  Result Value Ref Range   Prothrombin Time 12.3 11.4 - 15.2 seconds   INR 0.9 0.8 - 1.2    Comment: (NOTE) INR goal varies based on device and disease states. Performed  at [facility], [address] [place], [place] [address]   Type and screen [facility]     Status: None   Collection Time: 09/30/20  4:04 PM  Result Value Ref Range   ABO/RH(D) O POS    Antibody Screen NEG    Sample Expiration      10/03/2020,2359 Performed at [facility], [address] [place], [place] [address]   Protein / creatinine ratio, urine     Status: None   Collection Time: 09/30/20  4:04 PM  Result Value Ref Range   Creatinine, Urine 30 mg/dL   Total Protein, Urine <6 mg/dL    Comment: NO NORMAL RANGE ESTABLISHED FOR THIS TEST   Protein Creatinine Ratio        0.00 - 0.15 mg/mg[Cre]    Comment: RESULT BELOW REPORTABLE RANGE, UNABLE TO CALCULATE. Performed at [facility], [address] [place], [place] [address]   hCG, quantitative, pregnancy     Status: Abnormal   Collection Time: 09/30/20  4:35 PM  Result Value Ref Range   hCG, Beta Chain, Quant, S 48 (H) <5 mIU/mL    Comment:          GEST. AGE      CONC.  (mIU/mL)   <=1 WEEK        5 - 50     2 WEEKS       50  - 500     3 WEEKS       100 - 10,000     4 WEEKS     1,000 - 30,000     5 WEEKS     3,500 - 115,000   6-8 WEEKS     12,000 - 270,000    12 WEEKS     15,000 - 220,000        FEMALE AND NON-PREGNANT FEMALE:     LESS THAN 5 mIU/mL Performed at [facility], [address] [place], [place] [address]   Urinalysis, Complete w Microscopic Urine, Catheterized     Status: Abnormal   Collection Time: 09/30/20  9:15 PM  Result Value Ref Range   Color, Urine YELLOW (A) YELLOW   APPearance HAZY (A) CLEAR   Specific Gravity, Urine 1.009 1.005 - 1.030   pH 8.0 5.0 - 8.0   Glucose, UA NEGATIVE NEGATIVE mg/dL   Hgb urine dipstick NEGATIVE NEGATIVE   Bilirubin Urine NEGATIVE NEGATIVE   Ketones, ur NEGATIVE NEGATIVE mg/dL   Protein, ur NEGATIVE NEGATIVE mg/dL   Nitrite POSITIVE (A) NEGATIVE   Leukocytes,Ua NEGATIVE NEGATIVE   RBC / HPF 6-10 0 - 5 RBC/hpf   WBC, UA 0-5 0 - 5 WBC/hpf   Bacteria, UA RARE (A) NONE SEEN   Squamous Epithelial / LPF NONE SEEN 0 - 5   Mucus PRESENT     Comment: Performed at [facility], [address] [place], [place] [address]  Urine Drug Screen, Qualitative     Status: Abnormal   Collection Time: 09/30/20  9:15 PM  Result Value Ref Range   Tricyclic, Ur Screen NONE DETECTED NONE DETECTED   Amphetamines, Ur Screen POSITIVE (A) NONE DETECTED   MDMA (Ecstasy)Ur Screen NONE DETECTED NONE DETECTED   Cocaine Metabolite,Ur [place] NONE DETECTED NONE DETECTED   Opiate, Ur Screen NONE DETECTED NONE DETECTED   Phencyclidine (PCP) Ur S NONE DETECTED NONE DETECTED   Cannabinoid 50 Ng, Ur [place] NONE DETECTED NONE DETECTED   Barbiturates, Ur Screen  NONE DETECTED NONE DETECTED   Benzodiazepine, Ur Scrn NONE DETECTED NONE DETECTED   Methadone Scn, Ur NONE DETECTED NONE DETECTED    Comment: (NOTE) Tricyclics + metabolites, urine    Cutoff 1000 ng/mL Amphetamines + metabolites, urine  Cutoff 1000 ng/mL MDMA (Ecstasy), urine              Cutoff 500 ng/mL Cocaine  Metabolite, urine          Cutoff 300 ng/mL Opiate + metabolites, urine        Cutoff 300 ng/mL Phencyclidine (PCP), urine         Cutoff 25 ng/mL Cannabinoid, urine                 Cutoff 50 ng/mL Barbiturates + metabolites, urine  Cutoff 200 ng/mL Benzodiazepine, urine              Cutoff 200 ng/mL Methadone, urine                   Cutoff 300 ng/mL  The urine drug screen provides only a preliminary, unconfirmed analytical test result and should not be used for non-medical purposes. Clinical consideration and professional judgment should be applied to any positive drug screen result due to possible interfering substances. A more specific alternate chemical method must be used in order to obtain a confirmed analytical result. Gas chromatography / mass spectrometry (GC/MS) is the preferred confirm atory method. Performed at [facility], [address] [place], [place] [address]   Resp Panel by RT-PCR (Flu A&B, Covid) Nasopharyngeal Swab     Status: None   Collection Time: 09/30/20  9:16 PM   Specimen: Nasopharyngeal Swab; Nasopharyngeal(NP) swabs in vial transport medium  Result Value Ref Range   SARS Coronavirus 2 by RT PCR NEGATIVE NEGATIVE    Comment: (NOTE) SARS-CoV-2 target nucleic acids are NOT DETECTED.  The SARS-CoV-2 RNA is generally detectable in upper respiratory specimens during the acute phase of infection. The lowest concentration of SARS-CoV-2 viral copies this assay can detect is 138 copies/mL. A negative result does not preclude SARS-Cov-2 infection and should not be used as the sole basis for treatment or other patient management decisions. A negative result may occur with  improper specimen collection/handling, submission of specimen other than nasopharyngeal swab, presence of viral mutation(s) within the areas targeted by this assay, and inadequate number of viral copies(<138 copies/mL). A negative result must be combined with clinical  observations, patient history, and epidemiological information. The expected result is Negative.  Fact Sheet for Patients:  BloggerCourse.com  Fact Sheet for Healthcare Providers:  SeriousBroker.it  This test is no t yet approved or cleared by the [place] FDA and  has been authorized for detection and/or diagnosis of SARS-CoV-2 by FDA under an Emergency Use Authorization (EUA). This EUA will remain  in effect (meaning this test can be used) for the duration of the COVID-19 declaration under Section 564(b)(1) of the Act, 21 U.S.C.section 360bbb-3(b)(1), unless the authorization is terminated  or revoked sooner.       Influenza A by PCR NEGATIVE NEGATIVE   Influenza B by PCR NEGATIVE NEGATIVE    Comment: (NOTE) The Xpert Xpress SARS-CoV-2/FLU/RSV plus assay is intended as an aid in the diagnosis of influenza from Nasopharyngeal swab specimens and should not be used as a sole basis for treatment. Nasal washings and aspirates are unacceptable for Xpert Xpress SARS-CoV-2/FLU/RSV testing.  Fact Sheet for Patients: BloggerCourse.com  Fact Sheet for Healthcare Providers: SeriousBroker.it  This test is not yet approved or cleared by the [place] and has been authorized for detection and/or diagnosis of SARS-CoV-2 by FDA under an Emergency Use Authorization (EUA). This EUA will remain in effect (meaning this test can be used) for the duration of the COVID-19 declaration under Section 564(b)(1) of the Act, 21 U.S.C. section 360bbb-3(b)(1), unless the authorization is terminated or revoked.  Performed at [facility], [address] [place], [place] [address]   CBC     Status: Abnormal   Collection Time: 10/01/20  5:35 AM  Result Value Ref Range   WBC 11.3 (H) 4.0 - 10.5 K/uL   RBC 3.49 (L) 3.87 - 5.11 MIL/uL   Hemoglobin 8.8 (L) 12.0 - 15.0 g/dL     Comment: Reticulocyte Hemoglobin testing may be clinically indicated, consider ordering this additional test [id number]    HCT 27.2 (L) 36.0 - 46.0 %   MCV 77.9 (L) 80.0 - 100.0 fL   MCH 25.2 (L) 26.0 - 34.0 pg   MCHC 32.4 30.0 - 36.0 g/dL   RDW 68.3 (H) 41.9 - 62.2 %   Platelets 464 (H) 150 - 400 K/uL   nRBC 0.0 0.0 - 0.2 %    Comment: Performed at [facility], [address], [place], [place] [address]    Assessment:   35 y.o. [id number] postpartum day # 13  Plan:   1)Secondary postpartum hemorrhage- bleeding appears to of stabilized with methergine. Continue TID for 3-5 days at home   2)  Acute blood loss anemia - hemodynamically stable and asymptomatic - po ferrous sulfate - If patient feels dizzy or light headed today will consider transfusion.   3) UTI- continue with macrobid BID for 5 days.   4) Will advance to regular diet if she continues to do well this morning   5) Disposition - Likely discharge home this afternoon if she continues to improve.  Adelene Idler MD [facility], [facility] 10/01/2020 7:38 AM

## 2020-10-03 ENCOUNTER — Telehealth: Payer: Medicaid Other

## 2020-10-03 LAB — URINE CULTURE: Culture: >=100000 — AB

## 2020-10-03 NOTE — Telephone Encounter (Signed)
Pt called after hour nurse 10/01/20 10:57pm; was in hosp earlier and methylergonovine was rx'd; the pharm does not have med in stock and the closest one is 3hrs away; she's ck'd with the local pharmacies and none of them have it.  Pt is requesting to speak to on call provider; denies any new or worsening sxs at this time and denied triage.  Per PH after hour nurse adv pt med is in short supply, try to get it tomorrow; if not then try to see how bad the bleeding is without the medicine. LMTC.

## 2020-10-04 ENCOUNTER — Telehealth: Payer: Self-pay

## 2020-10-04 NOTE — Telephone Encounter (Signed)
Called patient to verify that she delivered her baby on 09/18/17 - per Debbie B.  Also needed to let her know that I have cancelled her 10/07/20 ultrasound appointment.

## 2020-10-07 ENCOUNTER — Ambulatory Visit: Payer: Medicaid Other

## 2020-10-18 NOTE — Telephone Encounter (Signed)
Left msg for pt to call and let me know how she is doing.

## 2020-10-19 NOTE — Telephone Encounter (Signed)
Left 2nd msg to call back.

## 2020-10-24 NOTE — Telephone Encounter (Signed)
Pt hasn't replied; pt has appt c [facility] on the 17th.

## 2020-10-31 ENCOUNTER — Other Ambulatory Visit: Payer: Self-pay

## 2020-10-31 ENCOUNTER — Ambulatory Visit (INDEPENDENT_AMBULATORY_CARE_PROVIDER_SITE_OTHER): Payer: Medicaid Other | Admitting: Obstetrics and Gynecology

## 2020-10-31 ENCOUNTER — Encounter: Payer: Self-pay | Admitting: Obstetrics and Gynecology

## 2020-10-31 DIAGNOSIS — Z30015 Encounter for initial prescription of vaginal ring hormonal contraceptive: Secondary | ICD-10-CM

## 2020-10-31 MED ORDER — ETONOGESTREL-ETHINYL ESTRADIOL 0.12-0.015 MG/24HR VA RING: VAGINAL_RING | VAGINAL | 11 refills | Status: DC

## 2020-10-31 NOTE — Progress Notes (Signed)
Postpartum Visit  Chief Complaint:  Chief Complaint  Patient presents with   Post-op Follow-up    6wk postpartum - RM 4    History of Present Illness: Patient is a 35 y.o. [name] presents for postpartum visit.  Date of delivery: .09/30/2020 Type of delivery: Vaginal delivery - Vacuum or forceps assisted  no Episiotomy No.  Laceration: no  Pregnancy or labor problems:  precipitous home delivery Any problems since the delivery:  no  Newborn Details:  SINGLETON :  Breast or formula feeding: plans to bottle feed Contraception after delivery: No  Any bowel or bladder issues: No  Post partum depression/anxiety noted:  no Edinburgh Post-Partum Depression Score:Cassandra Date of last PAP: 8/30/2020NIL and HR HPV negative   Review of Systems: Review of Systems  Constitutional: Negative.   Gastrointestinal: Negative.   Genitourinary: Negative.    The following portions of the patient's history were reviewed and updated as appropriate: allergies, current medications, past family history, past medical history, past social history, past surgical history, and problem list.  Past Medical History:  Past Medical History:  Diagnosis Date   ADHD (attention deficit hyperactivity disorder)    Anxiety    Asthma    Kidney stone     Past Surgical History:  Past Surgical History:  Procedure Laterality Date   ELBOW FRACTURE SURGERY     EYE MUSCLE SURGERY Bilateral    OVARIAN CYST REMOVAL Right    TONSILLECTOMY      Family History:  No family history on file.  Social History:  Social History   Socioeconomic History   Marital status: Single    Spouse name: Not on file   Number of children: Not on file   Years of education: Not on file   Highest education level: Not on file  Occupational History   Occupation: delivery driver    Comment: door dash  Tobacco Use   Smoking status: Every Day    Types: Cigarettes   Smokeless tobacco: Never  Vaping Use   Vaping Use: Never used   Substance and Sexual Activity   Alcohol use: Not Currently   Drug use: Not Currently   Sexual activity: Not Currently  Other Topics Concern   Not on file  Social History Narrative   Not on file   Social Determinants of Health   Financial Resource Strain: Not on file  Food Insecurity: Not on file  Transportation Needs: Not on file  Physical Activity: Not on file  Stress: Not on file  Social Connections: Not on file  Intimate Partner Violence: Not on file    Allergies:  Allergies  Allergen Reactions   Metronidazole Nausea And Vomiting   Prednisone Other (See Comments)    irritable/ angry and very lethargic when finishes    Medications: Prior to Admission medications   Medication Sig Start Date End Date Taking? Authorizing Provider  ADDERALL XR 30 MG 24 hr capsule Take 30 mg by mouth 2 (two) times daily. 09/01/20  Yes [provider]  buprenorphine (SUBUTEX) 8 MG SUBL SL tablet Place 8 mg under the tongue 2 (two) times daily. 09/09/20  Yes [provider]  buPROPion (WELLBUTRIN SR) 150 MG Cassandra hr tablet Take 150 mg by mouth every morning. 08/21/20  Yes [provider]  escitalopram (LEXAPRO) 20 MG tablet Take 20 mg by mouth daily. 09/01/20  Yes [provider]  methylergonovine (METHERGINE) 0.2 MG tablet Take 1 tablet (0.2 mg total) by mouth 3 (three) times daily.  10/01/20  Yes Nadara Mustard, MD  traZODone (DESYREL) 100 MG tablet Take 200 mg by mouth at bedtime as needed. 09/13/20  Yes [provider]  ibuprofen (ADVIL) 600 MG tablet Take 1 tablet (600 mg total) by mouth every 6 (six) hours. 09/19/20   Mirna Mires, CNM  nitrofurantoin, macrocrystal-monohydrate, (MACROBID) 100 MG capsule Take 1 capsule (100 mg total) by mouth every Cassandra (twelve) hours. Patient not taking: Reported on 10/31/2020 10/01/20   Nadara Mustard, MD  promethazine (PHENERGAN) 25 MG suppository Place rectally. 10/27/20   [provider]    Physical  Exam Blood pressure 100/60, height 5\' 2"  (1.575 m), weight 107 lb (48.5 kg), not currently breastfeeding.    General: NAD HEENT: normocephalic, anicteric Pulmonary: No increased work of breathing Abdomen: NABS, soft, non-tender, non-distended.  Umbilicus without lesions.  No hepatomegaly, splenomegaly or masses palpable. No evidence of hernia. Genitourinary:  External: Normal external female genitalia.  Normal urethral meatus, normal  Bartholin's and Skene's glands.    Vagina: Normal vaginal mucosa, no evidence of prolapse.    Cervix: Grossly normal in appearance, no bleeding  Uterus: Non-enlarged, mobile, normal contour.  No CMT  Adnexa: ovaries non-enlarged, no adnexal masses  Rectal: deferred Extremities: no edema, erythema, or tenderness Neurologic: Grossly intact Psychiatric: mood appropriate, affect full    Assessment: 35 y.o. 31 presenting for 6 week postpartum visit  Plan: Problem List Items Addressed This Visit   None Visit Diagnoses     6 weeks postpartum follow-up    -  Primary   Encounter for initial prescription of vaginal ring hormonal contraceptive            1) Contraception - Education given regarding options for contraception, as well as compatibility with breast feeding if applicable.  Patient plans on NuvaRing vaginal inserts for contraception.  2)  Pap - ASCCP guidelines and rational discussed.  ASCCP guidelines and rational discussed.  Patient opts for every 3 years screening interval  3) Patient underwent screening for postpartum depression she is already established with a provider for her psychiatric care  4) Return in about 1 year (around 10/31/2021) for annual.   11/02/2021, MD, [facility], [facility] 10/31/2020, 4:17 PM

## 2020-12-24 ENCOUNTER — Encounter: Payer: Self-pay | Admitting: Emergency Medicine

## 2020-12-24 ENCOUNTER — Emergency Department
Admission: EM | Admit: 2020-12-24 | Discharge: 2020-12-24 | Disposition: A | Discharge status: Home / Self Care | Payer: Medicaid Other | Attending: Emergency Medicine | Admitting: Emergency Medicine

## 2020-12-24 ENCOUNTER — Other Ambulatory Visit: Payer: Self-pay

## 2020-12-24 DIAGNOSIS — S61011A Laceration without foreign body of right thumb without damage to nail, initial encounter: Secondary | ICD-10-CM | POA: Diagnosis present

## 2020-12-24 DIAGNOSIS — W268XXA Contact with other sharp object(s), not elsewhere classified, initial encounter: Secondary | ICD-10-CM | POA: Diagnosis not present

## 2020-12-24 DIAGNOSIS — S81802A Unspecified open wound, left lower leg, initial encounter: Secondary | ICD-10-CM | POA: Insufficient documentation

## 2020-12-24 DIAGNOSIS — Z5321 Procedure and treatment not carried out due to patient leaving prior to being seen by health care provider: Secondary | ICD-10-CM | POA: Insufficient documentation

## 2020-12-24 DIAGNOSIS — S60519A Abrasion of unspecified hand, initial encounter: Secondary | ICD-10-CM | POA: Diagnosis not present

## 2020-12-24 NOTE — ED Triage Notes (Signed)
Pt called multiple times, not visualized in the lobby at this time, pt not answering her cell phone at this time.

## 2020-12-24 NOTE — ED Notes (Signed)
Patient called x 3. No answer. 

## 2020-12-24 NOTE — ED Triage Notes (Signed)
Patient has .5in laceration to right thumb.  Patient states she was opening a can of cat food and cut her thumb, on top of a cut that occurred approx. 1 week ago when she injured herself when moving a ladder and she also her her left leg.  Patient showed this RN an open wound to her left lower leg.  Patient states she went to urgent care but refused stitches.  Patient has mutliple small scratches to her hands she states are from her 10 cats.

## 2021-09-14 ENCOUNTER — Emergency Department: Payer: Medicaid Other

## 2021-09-14 ENCOUNTER — Inpatient Hospital Stay
Admission: EM | Admit: 2021-09-14 | Discharge: 2021-09-21 | DRG: 917 | Disposition: A | Discharge status: Home / Self Care | Payer: Medicaid Other | Attending: Osteopathic Medicine | Admitting: Osteopathic Medicine

## 2021-09-14 ENCOUNTER — Encounter: Payer: Self-pay | Admitting: Emergency Medicine

## 2021-09-14 DIAGNOSIS — K920 Hematemesis: Secondary | ICD-10-CM | POA: Diagnosis not present

## 2021-09-14 DIAGNOSIS — F15221 Other stimulant dependence with intoxication delirium: Secondary | ICD-10-CM | POA: Diagnosis present

## 2021-09-14 DIAGNOSIS — F419 Anxiety disorder, unspecified: Secondary | ICD-10-CM | POA: Diagnosis present

## 2021-09-14 DIAGNOSIS — T50905A Adverse effect of unspecified drugs, medicaments and biological substances, initial encounter: Secondary | ICD-10-CM

## 2021-09-14 DIAGNOSIS — F1410 Cocaine abuse, uncomplicated: Secondary | ICD-10-CM | POA: Diagnosis present

## 2021-09-14 DIAGNOSIS — F17210 Nicotine dependence, cigarettes, uncomplicated: Secondary | ICD-10-CM | POA: Diagnosis present

## 2021-09-14 DIAGNOSIS — T50901A Poisoning by unspecified drugs, medicaments and biological substances, accidental (unintentional), initial encounter: Secondary | ICD-10-CM | POA: Diagnosis present

## 2021-09-14 DIAGNOSIS — M6282 Rhabdomyolysis: Secondary | ICD-10-CM | POA: Diagnosis present

## 2021-09-14 DIAGNOSIS — Z7289 Other problems related to lifestyle: Secondary | ICD-10-CM

## 2021-09-14 DIAGNOSIS — T17910A Gastric contents in respiratory tract, part unspecified causing asphyxiation, initial encounter: Secondary | ICD-10-CM | POA: Diagnosis present

## 2021-09-14 DIAGNOSIS — E876 Hypokalemia: Secondary | ICD-10-CM | POA: Diagnosis present

## 2021-09-14 DIAGNOSIS — F1510 Other stimulant abuse, uncomplicated: Secondary | ICD-10-CM | POA: Diagnosis present

## 2021-09-14 DIAGNOSIS — G928 Other toxic encephalopathy: Secondary | ICD-10-CM | POA: Diagnosis present

## 2021-09-14 DIAGNOSIS — Z781 Physical restraint status: Secondary | ICD-10-CM

## 2021-09-14 DIAGNOSIS — E039 Hypothyroidism, unspecified: Secondary | ICD-10-CM | POA: Diagnosis present

## 2021-09-14 DIAGNOSIS — F988 Other specified behavioral and emotional disorders with onset usually occurring in childhood and adolescence: Secondary | ICD-10-CM | POA: Diagnosis present

## 2021-09-14 DIAGNOSIS — F1920 Other psychoactive substance dependence, uncomplicated: Secondary | ICD-10-CM | POA: Diagnosis present

## 2021-09-14 DIAGNOSIS — F29 Unspecified psychosis not due to a substance or known physiological condition: Secondary | ICD-10-CM

## 2021-09-14 DIAGNOSIS — F1490 Cocaine use, unspecified, uncomplicated: Secondary | ICD-10-CM

## 2021-09-14 DIAGNOSIS — G43909 Migraine, unspecified, not intractable, without status migrainosus: Secondary | ICD-10-CM | POA: Diagnosis present

## 2021-09-14 DIAGNOSIS — J45909 Unspecified asthma, uncomplicated: Secondary | ICD-10-CM | POA: Diagnosis present

## 2021-09-14 DIAGNOSIS — D62 Acute posthemorrhagic anemia: Secondary | ICD-10-CM | POA: Diagnosis not present

## 2021-09-14 DIAGNOSIS — F1290 Cannabis use, unspecified, uncomplicated: Secondary | ICD-10-CM

## 2021-09-14 DIAGNOSIS — Z79899 Other long term (current) drug therapy: Secondary | ICD-10-CM

## 2021-09-14 DIAGNOSIS — F32A Depression, unspecified: Secondary | ICD-10-CM | POA: Diagnosis present

## 2021-09-14 DIAGNOSIS — F19121 Other psychoactive substance abuse with intoxication delirium: Secondary | ICD-10-CM | POA: Diagnosis present

## 2021-09-14 DIAGNOSIS — F1120 Opioid dependence, uncomplicated: Secondary | ICD-10-CM | POA: Diagnosis present

## 2021-09-14 DIAGNOSIS — T405X2A Poisoning by cocaine, intentional self-harm, initial encounter: Principal | ICD-10-CM

## 2021-09-14 DIAGNOSIS — R636 Underweight: Secondary | ICD-10-CM | POA: Diagnosis present

## 2021-09-14 DIAGNOSIS — R410 Disorientation, unspecified: Secondary | ICD-10-CM

## 2021-09-14 DIAGNOSIS — F909 Attention-deficit hyperactivity disorder, unspecified type: Secondary | ICD-10-CM | POA: Diagnosis present

## 2021-09-14 DIAGNOSIS — J9601 Acute respiratory failure with hypoxia: Secondary | ICD-10-CM | POA: Diagnosis present

## 2021-09-14 DIAGNOSIS — Z888 Allergy status to other drugs, medicaments and biological substances status: Secondary | ICD-10-CM

## 2021-09-14 DIAGNOSIS — Z681 Body mass index (BMI) 19 or less, adult: Secondary | ICD-10-CM

## 2021-09-14 DIAGNOSIS — R451 Restlessness and agitation: Secondary | ICD-10-CM

## 2021-09-14 LAB — COMPREHENSIVE METABOLIC PANEL
ALT: 17 U/L (ref 0–44)
AST: 36 U/L (ref 15–41)
Albumin: 4.3 g/dL (ref 3.5–5.0)
Alkaline Phosphatase: 70 U/L (ref 38–126)
Anion gap: 8 (ref 5–15)
BUN: 18 mg/dL (ref 6–20)
CO2: 25 mmol/L (ref 22–32)
Calcium: 8.9 mg/dL (ref 8.9–10.3)
Chloride: 104 mmol/L (ref 98–111)
Creatinine, Ser: 0.67 mg/dL (ref 0.44–1.00)
GFR, Estimated: >60 mL/min (ref >60)
Glucose, Bld: 97 mg/dL (ref 70–99)
Potassium: 4.4 mmol/L (ref 3.5–5.1)
Sodium: 137 mmol/L (ref 135–145)
Total Bilirubin: 0.9 mg/dL (ref 0.3–1.2)
Total Protein: 8.4 g/dL — ABNORMAL HIGH (ref 6.5–8.1)

## 2021-09-14 LAB — CBC
HCT: 41.9 % (ref 36.0–46.0)
Hemoglobin: 12.5 g/dL (ref 12.0–15.0)
MCH: 23.4 pg — ABNORMAL LOW (ref 26.0–34.0)
MCHC: 29.8 g/dL — ABNORMAL LOW (ref 30.0–36.0)
MCV: 78.3 fL — ABNORMAL LOW (ref 80.0–100.0)
Platelets: 566 10*3/uL — ABNORMAL HIGH (ref 150–400)
RBC: 5.35 MIL/uL — ABNORMAL HIGH (ref 3.87–5.11)
RDW: 16.4 % — ABNORMAL HIGH (ref 11.5–15.5)
WBC: 6.3 10*3/uL (ref 4.0–10.5)
nRBC: 0 % (ref 0.0–0.2)

## 2021-09-14 LAB — ACETAMINOPHEN LEVEL: Acetaminophen (Tylenol), Serum: <10 ug/mL — ABNORMAL LOW (ref 10–30)

## 2021-09-14 LAB — CK: Total CK: 192 U/L (ref 38–234)

## 2021-09-14 LAB — HCG, QUANTITATIVE, PREGNANCY: hCG, Beta Chain, Quant, S: <1 m[IU]/mL (ref <5)

## 2021-09-14 LAB — TSH: TSH: 0.801 u[IU]/mL (ref 0.350–4.500)

## 2021-09-14 LAB — ETHANOL: Alcohol, Ethyl (B): <10 mg/dL (ref <10)

## 2021-09-14 LAB — SALICYLATE LEVEL: Salicylate Lvl: <7 mg/dL — ABNORMAL LOW (ref 7.0–30.0)

## 2021-09-14 MED ORDER — DIPHENHYDRAMINE HCL 50 MG/ML IJ SOLN
25.0000 mg | Freq: Once | INTRAMUSCULAR | Status: AC
  Administered 2021-09-14: 25 mg via INTRAMUSCULAR
  Filled 2021-09-14: qty 1

## 2021-09-14 MED ORDER — LORAZEPAM 2 MG/ML IJ SOLN: 2.0000 mg | Freq: Once | INTRAMUSCULAR | Status: DC

## 2021-09-14 MED ORDER — DROPERIDOL 2.5 MG/ML IJ SOLN
2.5000 mg | Freq: Once | INTRAMUSCULAR | Status: AC
  Administered 2021-09-14: 2.5 mg via INTRAMUSCULAR

## 2021-09-14 MED ORDER — SODIUM CHLORIDE 0.9 % IV BOLUS: 1000.0000 mL | Freq: Once | INTRAVENOUS | Status: DC

## 2021-09-14 MED ORDER — LORAZEPAM 2 MG/ML IJ SOLN
3.0000 mg | Freq: Once | INTRAMUSCULAR | Status: AC
  Administered 2021-09-14: 3 mg via INTRAMUSCULAR
  Filled 2021-09-14: qty 2

## 2021-09-14 MED ORDER — HALOPERIDOL LACTATE 5 MG/ML IJ SOLN
5.0000 mg | Freq: Once | INTRAMUSCULAR | Status: AC
  Administered 2021-09-14: 5 mg via INTRAMUSCULAR
  Filled 2021-09-14: qty 1

## 2021-09-14 MED ORDER — LORAZEPAM 2 MG/ML IJ SOLN
2.0000 mg | Freq: Once | INTRAMUSCULAR | Status: AC
  Administered 2021-09-14: 2 mg via INTRAMUSCULAR
  Filled 2021-09-14: qty 1

## 2021-09-14 MED ORDER — BUPRENORPHINE HCL-NALOXONE HCL 8-2 MG SL SUBL
1.0000 | SUBLINGUAL_TABLET | Freq: Every day | SUBLINGUAL | Status: DC
  Administered 2021-09-14: 1 via SUBLINGUAL
  Filled 2021-09-14: qty 1

## 2021-09-14 NOTE — ED Provider Notes (Signed)
[facility] Provider Note    Event Date/Time   First MD Initiated Contact with Patient 09/14/21 2136     (approximate)   History   Psychiatric Evaluation   HPI  Cassandra [name] is a 36 y.o. female here yelling, concerned that she took a medication or substance is causing her to feel extremely anxious.  She reports that she took some type of medication or other substance this evening and started feeling extremely anxious and shaky.  There are some report that she may have taken one of her boyfriend or someone else's medications for which she does have a bottle of 21 tablets of amphetamine salts with her.  She reports a history of prior pregnancy.  She also reports that she feels like she is "withdrawing" from running out of Subutex.  However she cannot really expand much further on that.  Denies any obvious injury.  No report of any self harming ideations.  Denies suicidal ideation.     Physical Exam   Triage Vital Signs: ED Triage Vitals  Enc Vitals Group     BP      Pulse      Resp      Temp      Temp src      SpO2      Weight      Height      Head Circumference      Peak Flow      Pain Score      Pain Loc      Pain Edu?      Excl. in GC?     Most recent vital signs: Vitals:   09/14/21 2138 09/14/21 2158  BP: 120/85   Pulse: (!) 106   Resp: (!) 22   Temp:  98.5 F (36.9 C)  SpO2: 100%      General: Awake, able to give her name, knows where she is, but extremely anxious, tremulous, kicking about, rolling from side to side.  Frequently yelling out "help" then begins asking if we can help her to die.  Then begins to express some element of suicidal type ideations CV:  Good peripheral perfusion.  Resp:  Normal effort.  Becomes to tachypneic at times when extremely anxious, but then calms her breathing.  No obvious respiratory distress. Abd:  No distention.  Not obviously gravid.  No obvious pain to palpation in any  quadrant Other:  Questionable track marks on the skin.   ED Results / Procedures / Treatments   Labs (all labs ordered are listed, but only abnormal results are displayed) Labs Reviewed  CBC - Abnormal; Notable for the following components:      Result Value   RBC 5.35 (*)    MCV 78.3 (*)    MCH 23.4 (*)    MCHC 29.8 (*)    RDW 16.4 (*)    Platelets 566 (*)    All other components within normal limits  COMPREHENSIVE METABOLIC PANEL - Abnormal; Notable for the following components:   Total Protein 8.4 (*)    All other components within normal limits  SALICYLATE LEVEL - Abnormal; Notable for the following components:   Salicylate Lvl <7.0 (*)    All other components within normal limits  ACETAMINOPHEN LEVEL - Abnormal; Notable for the following components:   Acetaminophen (Tylenol), Serum <10 (*)    All other components within normal limits  CK  HCG, QUANTITATIVE, PREGNANCY  ETHANOL  TSH  URINE DRUG SCREEN, QUALITATIVE (  [facility] ONLY)     EKG  Pending at time of signout   RADIOLOGY CT head interpreted by me as negative for acute intracranial pathology  CT Head Wo Contrast  Result Date: 09/14/2021 CLINICAL DATA:  Facial trauma. Possible overdose. Small injury under bridge of the nose. EXAM: CT HEAD WITHOUT CONTRAST TECHNIQUE: Contiguous axial images were obtained from the base of the skull through the vertex without intravenous contrast. RADIATION DOSE REDUCTION: This exam was performed according to the departmental dose-optimization program which includes automated exposure control, adjustment of the mA and/or kV according to patient size and/or use of iterative reconstruction technique. COMPARISON:  None Available. FINDINGS: Brain: No evidence of acute infarction, hemorrhage, hydrocephalus, extra-axial collection or mass lesion/mass effect. Vascular: No hyperdense vessel or unexpected calcification. Skull: Normal. Negative for fracture or focal lesion. Sinuses/Orbits: No acute  finding. Other: None. IMPRESSION: No acute intracranial abnormality. Electronically Signed   By: Larose Hires D.O.   On: 09/14/2021 22:30        PROCEDURES:  Critical Care performed: Yes, see critical care procedure note(s)  CRITICAL CARE Performed by: Sharyn Creamer   Total critical care time: 35 minutes  Critical care time was exclusive of separately billable procedures and treating other patients.  Critical care was necessary to treat or prevent imminent or life-threatening deterioration.  Critical care was time spent personally by me on the following activities: development of treatment plan with patient and/or surrogate as well as nursing, discussions with consultants, evaluation of patient's response to treatment, examination of patient, obtaining history from patient or surrogate, ordering and performing treatments and interventions, ordering and review of laboratory studies, ordering and review of radiographic studies, pulse oximetry and re-evaluation of patient's condition.  Patient with acute psychosis severe agitation requiring multiple doses of intramuscular medications for calming and sedation.  Procedures   MEDICATIONS ORDERED IN ED: Medications  buprenorphine-naloxone (SUBOXONE) 8-2 mg per SL tablet 1 tablet (1 tablet Sublingual Given 09/14/21 2153)  LORazepam (ATIVAN) injection 2 mg (2 mg Intramuscular Given 09/14/21 2146)  LORazepam (ATIVAN) injection 3 mg (3 mg Intramuscular Given 09/14/21 2236)  diphenhydrAMINE (BENADRYL) injection 25 mg (25 mg Intramuscular Given 09/14/21 2314)  haloperidol lactate (HALDOL) injection 5 mg (5 mg Intramuscular Given 09/14/21 2312)  droperidol (INAPSINE) 2.5 MG/ML injection 2.5 mg (2.5 mg Intramuscular Given 09/14/21 2335)     IMPRESSION / MDM / ASSESSMENT AND PLAN / ED COURSE  I reviewed the triage vital signs and the nursing notes.                              Differential diagnosis includes, but is not limited to, likely some type  of substance induced issue based on the presentation and history provided of her taking a medication or some substance that certainly sparked her to feel extremely anxious.  In addition, evidently others in the home also needed EMS assistance, possibly of similar nature.  She appears elevated in mood, extremely anxious, almost as though suffering from some sort of sympathomimetic type of substance.  Will give intramuscular Ativan to calm and facilitate evaluation.  Patient placed under involuntary commitment as she is making expressions of wanting assistance in dying her expressing some concerns about suicidal type ideation later in her care.  Differential also includes other etiologies such as traumatic injury, intracranial hemorrhage, toxic metabolic cause etc.  No obvious infectious symptomatology or preceding illness this apparently started quite suddenly after she took some  sort of an unknown substance this evening  Patient's presentation is most consistent with acute presentation with potential threat to life or bodily function.   Clinical Course as of 09/15/21 0014  Thu Sep 14, 2021  2146 Patient mood extremely elevated, yelling, difficult to facilitate medical evaluation.  Patient placed under IVC.  Intramuscular Ativan ordered.  He is extremely agitated, likely suffering from an acute sympathomimetic like overdoses primary concern certainly further work-up is pending. [MQ]  2224 CT head interpreted by me for acute gross pathology, no acute intracranial hemorrhage denoted [MQ]  2225 Discussed case with psychiatry team, Adela Lank nurse practitioner.  She is currently seeing the patient.  Patient is much Colmer now, resting comfortably without distress after receiving Suboxone and lorazepam. [MQ]    Clinical Course User Index [MQ] Sharyn Creamer, MD   To this point Labs interpreted as normal comprehensive metabolic panel and CK. Minimally elevated platelet count normal white count, low MCV.   Normal TSH  ----------------------------------------- 12:13 AM on 09/15/2021 ----------------------------------------- Ongoing care assigned to Dr. Dolores Frame.  Patient still requiring ongoing observation for agitation and further treatment, as well as under IVC with pending psychiatry consult which I have discussed the consult request with Gillermo Murdoch  FINAL CLINICAL IMPRESSION(S) / ED DIAGNOSES   Final diagnoses:  Psychosis, unspecified psychosis type (HCC)  Agitation     Rx / DC Orders   ED Discharge Orders     None        Note:  This document was prepared using Dragon voice recognition software and may include unintentional dictation errors.   Sharyn Creamer, MD 09/15/21 [phone]

## 2021-09-14 NOTE — ED Triage Notes (Signed)
Pt comes from home via [facility]. Pt arrives screaming and yelling incoherently. Pt states she takes buprenorphine and went to go pick it up from pharmacy but they gave her the wrong medication. Pt states shes "going through withdrawals". Pt arrives with a medication bottles of lexapro and amphetamine salts, which are not prescribed to her.  92 HR 100% RA 126/98

## 2021-09-14 NOTE — Consult Note (Signed)
[facility] Face-to-Face Psychiatry Consult   Reason for Consult: Psychiatric Evaluation Referring Physician: Dr. Fanny Bien Patient Identification: Cassandra [name] MRN:  [id number] Principal Diagnosis: <principal problem not specified> Diagnosis:  Active Problems:   ADD (attention deficit disorder)   Anxiety and depression   Hypothyroidism   Polysubstance dependence (HCC)   Migraine   Total Time spent with patient: 1 hour  Subjective: "I am having withdrawals from my medications"   Cassandra [name] is a 36 y.o. female patient presented to [facility]  from home and she is placed under involuntary commitment status (IVC). Per the ED triage nurses note, Pt comes from home via [facility]. Pt arrives screaming and yelling incoherently. Pt states she takes buprenorphine and went to go pick it up from pharmacy but they gave her the wrong medication. Pt states shes "going through withdrawals". Pt arrives with a medication bottles of lexapro and amphetamine salts, which are not prescribed to her. The patient remains agitated due to the patient using methamphetamine and fentanyl. The patient stated that "I used fentanyl and meth yesterday." The patient is extremely agitated sitting on the bathroom floor, experiencing internal and external stimuli.  This provider saw The patient face-to-face; the chart was reviewed, and consulted with Dr.Quale on 09/14/2021 due to the patient's care. It was discussed with the EDP that she will remain under observation overnight and reassessed in the morning to determine if she is psychiatrically safe to be discharged.   On evaluation, the patient is alert and oriented x 2-3, calm, cooperative, and mood-congruent with affect. The patient does appear to be responding to internal or external stimuli. The patient is presenting with some delusional thinking. The patient denies auditory or visual hallucinations. The patient denies any suicidal, homicidal, or self-harm ideations. The patient is  presenting with some psychotic and paranoid behaviors. During an encounter with the patient, she could answer questions appropriately.  HPI:    Past Psychiatric History: ADHD (attention deficit hyperactivity disorder) Anxiety   Risk to Self:   Risk to Others:   Prior Inpatient Therapy:   Prior Outpatient Therapy:    Past Medical History:  Past Medical History:  Diagnosis Date   ADHD (attention deficit hyperactivity disorder)    Anxiety    Asthma    Kidney stone     Past Surgical History:  Procedure Laterality Date   ELBOW FRACTURE SURGERY     EYE MUSCLE SURGERY Bilateral    OVARIAN CYST REMOVAL Right    TONSILLECTOMY     Family History: History reviewed. No pertinent family history. Family Psychiatric  History:  Social History:  Social History   Substance and Sexual Activity  Alcohol Use Not Currently     Social History   Substance and Sexual Activity  Drug Use Not Currently    Social History   Socioeconomic History   Marital status: Single    Spouse name: Not on file   Number of children: Not on file   Years of education: Not on file   Highest education level: Not on file  Occupational History   Occupation: delivery driver    Comment: door dash  Tobacco Use   Smoking status: Every Day    Packs/day: 0.50    Types: Cigarettes   Smokeless tobacco: Never  Vaping Use   Vaping Use: Never used  Substance and Sexual Activity   Alcohol use: Not Currently   Drug use: Not Currently   Sexual activity: Not Currently  Other Topics Concern   Not  on file  Social History Narrative   Not on file   Social Determinants of Health   Financial Resource Strain: Not on file  Food Insecurity: Not on file  Transportation Needs: Not on file  Physical Activity: Not on file  Stress: Not on file  Social Connections: Not on file   Additional Social History:    Allergies:   Allergies  Allergen Reactions   Metronidazole Nausea And Vomiting   Prednisone Other (See  Comments)    irritable/ angry and very lethargic when finishes    Labs:  Results for orders placed or performed during the hospital encounter of 09/14/21 (from the past 48 hour(s))  CBC     Status: Abnormal   Collection Time: 09/14/21  9:50 PM  Result Value Ref Range   WBC 6.3 4.0 - 10.5 K/uL   RBC 5.35 (H) 3.87 - 5.11 MIL/uL   Hemoglobin 12.5 12.0 - 15.0 g/dL   HCT 12.4 58.0 - 99.8 %   MCV 78.3 (L) 80.0 - 100.0 fL   MCH 23.4 (L) 26.0 - 34.0 pg   MCHC 29.8 (L) 30.0 - 36.0 g/dL   RDW 33.8 (H) 25.0 - 53.9 %   Platelets 566 (H) 150 - 400 K/uL   nRBC 0.0 0.0 - 0.2 %    Comment: Performed at [facility], [address] [place], [place] [address]  Salicylate level     Status: Abnormal   Collection Time: 09/14/21  9:50 PM  Result Value Ref Range   Salicylate Lvl <7.0 (L) 7.0 - 30.0 mg/dL    Comment: HEMOLYSIS AT THIS LEVEL MAY AFFECT RESULT Performed at [facility], [address] [place], [place] [address]   Acetaminophen level     Status: Abnormal   Collection Time: 09/14/21  9:50 PM  Result Value Ref Range   Acetaminophen (Tylenol), Serum <10 (L) 10 - 30 ug/mL    Comment: (NOTE) Therapeutic concentrations vary significantly. A range of 10-30 ug/mL  may be an effective concentration for many patients. However, some  are best treated at concentrations outside of this range. Acetaminophen concentrations >150 ug/mL at 4 hours after ingestion  and >50 ug/mL at 12 hours after ingestion are often associated with  toxic reactions.  Performed at [facility], [address] [place], [place] [address]   hCG, quantitative, pregnancy     Status: None   Collection Time: 09/14/21  9:50 PM  Result Value Ref Range   hCG, Beta Chain, Quant, S <1 <5 mIU/mL    Comment:          GEST. AGE      CONC.  (mIU/mL)   <=1 WEEK        5 - 50     2 WEEKS       50 - 500     3 WEEKS       100 - 10,000     4 WEEKS     1,000 - 30,000     5 WEEKS     3,500 - 115,000    6-8 WEEKS     12,000 - 270,000    12 WEEKS     15,000 - 220,000        FEMALE AND NON-PREGNANT FEMALE:     LESS THAN 5 mIU/mL Performed at [facility], [address], [place], [place] [address]   Ethanol     Status: None   Collection Time: 09/14/21  9:50 PM  Result  Value Ref Range   Alcohol, Ethyl (B) <10 <10 mg/dL    Comment: (NOTE) Lowest detectable limit for serum alcohol is 10 mg/dL.  For medical purposes only. Performed at [facility], [address] [place], [place] [address]     Current Facility-Administered Medications  Medication Dose Route Frequency Provider Last Rate Last Admin   buprenorphine-naloxone (SUBOXONE) 8-2 mg per SL tablet 1 tablet  1 tablet Sublingual Daily Gillermo Murdoch, NP   1 tablet at 09/14/21 2153   Current Outpatient Medications  Medication Sig Dispense Refill   ADDERALL XR 30 MG 24 hr capsule Take 30 mg by mouth 2 (two) times daily.     buprenorphine (SUBUTEX) 8 MG SUBL SL tablet Place 8 mg under the tongue 2 (two) times daily.     buPROPion (WELLBUTRIN SR) 150 MG 12 hr tablet Take 150 mg by mouth every morning.     escitalopram (LEXAPRO) 20 MG tablet Take 20 mg by mouth daily.     etonogestrel-ethinyl estradiol (NUVARING) 0.12-0.015 MG/24HR vaginal ring Insert vaginally and leave in place for 3 consecutive weeks, then remove for 1 week. 1 each 11   ibuprofen (ADVIL) 600 MG tablet Take 1 tablet (600 mg total) by mouth every 6 (six) hours. 30 tablet 0   methylergonovine (METHERGINE) 0.2 MG tablet Take 1 tablet (0.2 mg total) by mouth 3 (three) times daily. 8 tablet 0   nitrofurantoin, macrocrystal-monohydrate, (MACROBID) 100 MG capsule Take 1 capsule (100 mg total) by mouth every 12 (twelve) hours. (Patient not taking: Reported on 10/31/2020) 10 capsule 0   promethazine (PHENERGAN) 25 MG suppository Place rectally.     traZODone (DESYREL) 100 MG tablet Take 200 mg by mouth at bedtime as needed.       Musculoskeletal: Strength & Muscle Tone: within normal limits Gait & Station: normal Patient leans: N/A  Psychiatric Specialty Exam:  Presentation  General Appearance: Disheveled; Bizarre  Eye Contact:Fleeting  Speech:Garbled; Pressured  Speech Volume:Increased  Handedness:Right   Mood and Affect  Mood:Anxious; Euphoric; Irritable  Affect:Inappropriate; Full Range   Thought Process  Thought Processes:Disorganized  Descriptions of Associations:Loose  Orientation:Full (Time, Place and Person)  Thought Content:Illogical; Delusions  History of Schizophrenia/Schizoaffective disorder:No  Duration of Psychotic Symptoms:No data recorded Hallucinations:Hallucinations: Tactile  Ideas of Reference:Paranoia  Suicidal Thoughts:Suicidal Thoughts: No  Homicidal Thoughts:Homicidal Thoughts: No   Sensorium  Memory:Immediate Poor; Recent Poor; Remote Poor  Judgment:Poor  Insight:Lacking   Executive Functions  Concentration:Poor  Attention Span:Poor  Recall:Poor  Fund of Knowledge:Poor  Language:Poor   Psychomotor Activity  Psychomotor Activity:Psychomotor Activity: Normal   Assets  Assets:Physical Health; Resilience; Social Support   Sleep  Sleep:Sleep: Poor   Physical Exam: Physical Exam Vitals and nursing note reviewed.  Constitutional:      General: She is in acute distress.     Appearance: Normal appearance. She is ill-appearing.  HENT:     Head: Normocephalic and atraumatic.     Right Ear: External ear normal.     Left Ear: External ear normal.     Nose: Nose normal.     Mouth/Throat:     Mouth: Mucous membranes are dry.  Cardiovascular:     Rate and Rhythm: Tachycardia present.  Pulmonary:     Effort: Pulmonary effort is normal.  Musculoskeletal:        General: Normal range of motion.     Cervical back: Normal range of motion and neck supple.  Neurological:     Mental Status: She is alert.  She is disoriented.  Psychiatric:         Attention and Perception: She is inattentive.        Mood and Affect: Affect is tearful and inappropriate.        Speech: Speech is rapid and pressured and tangential.        Behavior: Behavior is uncooperative and agitated.        Thought Content: Thought content is paranoid and delusional.        Cognition and Memory: Cognition is impaired.        Judgment: Judgment is impulsive and inappropriate.    Review of Systems  Psychiatric/Behavioral:  Positive for substance abuse. The patient is nervous/anxious.   All other systems reviewed and are negative.  Blood pressure 120/85, pulse (!) 106, temperature 98.5 F (36.9 C), temperature source Oral, resp. rate (!) 22, height 5\' 2"  (1.575 m), weight 48.5 kg, SpO2 100 %, not currently breastfeeding. Body mass index is 19.56 kg/m.  Treatment Plan Summary: Medication management and Plan The patient will remain under observation overnight and reassessed in the morning to determine if she is psychiatrically safe to be discharged.  Disposition: Supportive therapy provided about ongoing stressors. The patient will remain under observation overnight and reassessed in the morning to determine if she is psychiatrically safe to be discharged.  , NP 09/14/2021 10:58 PM

## 2021-09-14 NOTE — ED Provider Notes (Signed)
Patient arrives resting calmly, but now is extremely agitated, yelling and screaming, crawling on the ground at present time.  She previously had called after Ativan, but evidently is now extremely agitated screaming and appears to have acute paranoia.  We will give intramuscular Ativan, patient continues under IVC   Sharyn Creamer, MD 09/14/21 2233

## 2021-09-15 ENCOUNTER — Emergency Department: Payer: Medicaid Other

## 2021-09-15 ENCOUNTER — Inpatient Hospital Stay: Payer: Medicaid Other

## 2021-09-15 DIAGNOSIS — J9601 Acute respiratory failure with hypoxia: Secondary | ICD-10-CM | POA: Diagnosis present

## 2021-09-15 DIAGNOSIS — F1120 Opioid dependence, uncomplicated: Secondary | ICD-10-CM | POA: Diagnosis present

## 2021-09-15 DIAGNOSIS — Z7289 Other problems related to lifestyle: Secondary | ICD-10-CM | POA: Diagnosis not present

## 2021-09-15 DIAGNOSIS — F419 Anxiety disorder, unspecified: Secondary | ICD-10-CM

## 2021-09-15 DIAGNOSIS — Z681 Body mass index (BMI) 19 or less, adult: Secondary | ICD-10-CM | POA: Diagnosis not present

## 2021-09-15 DIAGNOSIS — T50901D Poisoning by unspecified drugs, medicaments and biological substances, accidental (unintentional), subsequent encounter: Secondary | ICD-10-CM | POA: Diagnosis not present

## 2021-09-15 DIAGNOSIS — F1490 Cocaine use, unspecified, uncomplicated: Secondary | ICD-10-CM

## 2021-09-15 DIAGNOSIS — T405X2A Poisoning by cocaine, intentional self-harm, initial encounter: Secondary | ICD-10-CM | POA: Diagnosis present

## 2021-09-15 DIAGNOSIS — R451 Restlessness and agitation: Secondary | ICD-10-CM | POA: Diagnosis not present

## 2021-09-15 DIAGNOSIS — M6282 Rhabdomyolysis: Secondary | ICD-10-CM | POA: Diagnosis present

## 2021-09-15 DIAGNOSIS — F17210 Nicotine dependence, cigarettes, uncomplicated: Secondary | ICD-10-CM | POA: Diagnosis present

## 2021-09-15 DIAGNOSIS — F15221 Other stimulant dependence with intoxication delirium: Secondary | ICD-10-CM | POA: Diagnosis present

## 2021-09-15 DIAGNOSIS — E876 Hypokalemia: Secondary | ICD-10-CM | POA: Diagnosis present

## 2021-09-15 DIAGNOSIS — F1920 Other psychoactive substance dependence, uncomplicated: Secondary | ICD-10-CM | POA: Diagnosis not present

## 2021-09-15 DIAGNOSIS — G43909 Migraine, unspecified, not intractable, without status migrainosus: Secondary | ICD-10-CM | POA: Diagnosis present

## 2021-09-15 DIAGNOSIS — F1510 Other stimulant abuse, uncomplicated: Secondary | ICD-10-CM | POA: Diagnosis present

## 2021-09-15 DIAGNOSIS — G928 Other toxic encephalopathy: Secondary | ICD-10-CM | POA: Diagnosis present

## 2021-09-15 DIAGNOSIS — D62 Acute posthemorrhagic anemia: Secondary | ICD-10-CM | POA: Diagnosis not present

## 2021-09-15 DIAGNOSIS — Z781 Physical restraint status: Secondary | ICD-10-CM | POA: Diagnosis not present

## 2021-09-15 DIAGNOSIS — F1410 Cocaine abuse, uncomplicated: Secondary | ICD-10-CM | POA: Diagnosis present

## 2021-09-15 DIAGNOSIS — T50901A Poisoning by unspecified drugs, medicaments and biological substances, accidental (unintentional), initial encounter: Secondary | ICD-10-CM | POA: Diagnosis present

## 2021-09-15 DIAGNOSIS — Z888 Allergy status to other drugs, medicaments and biological substances status: Secondary | ICD-10-CM | POA: Diagnosis not present

## 2021-09-15 DIAGNOSIS — F19121 Other psychoactive substance abuse with intoxication delirium: Secondary | ICD-10-CM | POA: Diagnosis present

## 2021-09-15 DIAGNOSIS — J45909 Unspecified asthma, uncomplicated: Secondary | ICD-10-CM | POA: Diagnosis present

## 2021-09-15 DIAGNOSIS — F32A Depression, unspecified: Secondary | ICD-10-CM | POA: Diagnosis present

## 2021-09-15 DIAGNOSIS — R000 Tachycardia, unspecified: Secondary | ICD-10-CM | POA: Diagnosis not present

## 2021-09-15 DIAGNOSIS — F29 Unspecified psychosis not due to a substance or known physiological condition: Secondary | ICD-10-CM | POA: Diagnosis not present

## 2021-09-15 DIAGNOSIS — R636 Underweight: Secondary | ICD-10-CM | POA: Diagnosis present

## 2021-09-15 DIAGNOSIS — K920 Hematemesis: Secondary | ICD-10-CM | POA: Diagnosis not present

## 2021-09-15 DIAGNOSIS — E039 Hypothyroidism, unspecified: Secondary | ICD-10-CM | POA: Diagnosis present

## 2021-09-15 DIAGNOSIS — T50905A Adverse effect of unspecified drugs, medicaments and biological substances, initial encounter: Secondary | ICD-10-CM | POA: Diagnosis not present

## 2021-09-15 DIAGNOSIS — R410 Disorientation, unspecified: Secondary | ICD-10-CM | POA: Diagnosis not present

## 2021-09-15 DIAGNOSIS — F988 Other specified behavioral and emotional disorders with onset usually occurring in childhood and adolescence: Secondary | ICD-10-CM | POA: Diagnosis present

## 2021-09-15 LAB — BLOOD GAS, ARTERIAL
Acid-Base Excess: 0.9 mmol/L (ref 0.0–2.0)
Bicarbonate: 24 mmol/L (ref 20.0–28.0)
FIO2: 40 %
MECHVT: 450 mL
O2 Saturation: 99.8 %
PEEP: 5 cmH2O
Patient temperature: 37
RATE: 18 resp/min
pCO2 arterial: 33 mmHg (ref 32–48)
pH, Arterial: 7.47 — ABNORMAL HIGH (ref 7.35–7.45)
pO2, Arterial: 192 mmHg — ABNORMAL HIGH (ref 83–108)

## 2021-09-15 LAB — GLUCOSE, CAPILLARY
Glucose-Capillary: 80 mg/dL (ref 70–99)
Glucose-Capillary: 81 mg/dL (ref 70–99)

## 2021-09-15 LAB — COMPREHENSIVE METABOLIC PANEL
ALT: 21 U/L (ref 0–44)
AST: 46 U/L — ABNORMAL HIGH (ref 15–41)
Albumin: 3.5 g/dL (ref 3.5–5.0)
Alkaline Phosphatase: 53 U/L (ref 38–126)
Anion gap: 6 (ref 5–15)
BUN: 11 mg/dL (ref 6–20)
CO2: 22 mmol/L (ref 22–32)
Calcium: 7.5 mg/dL — ABNORMAL LOW (ref 8.9–10.3)
Chloride: 109 mmol/L (ref 98–111)
Creatinine, Ser: 0.41 mg/dL — ABNORMAL LOW (ref 0.44–1.00)
GFR, Estimated: >60 mL/min (ref >60)
Glucose, Bld: 110 mg/dL — ABNORMAL HIGH (ref 70–99)
Potassium: 3.3 mmol/L — ABNORMAL LOW (ref 3.5–5.1)
Sodium: 137 mmol/L (ref 135–145)
Total Bilirubin: 0.7 mg/dL (ref 0.3–1.2)
Total Protein: 6.6 g/dL (ref 6.5–8.1)

## 2021-09-15 LAB — CBC
HCT: 31.1 % — ABNORMAL LOW (ref 36.0–46.0)
Hemoglobin: 9.5 g/dL — ABNORMAL LOW (ref 12.0–15.0)
MCH: 23.4 pg — ABNORMAL LOW (ref 26.0–34.0)
MCHC: 30.5 g/dL (ref 30.0–36.0)
MCV: 76.6 fL — ABNORMAL LOW (ref 80.0–100.0)
Platelets: 294 10*3/uL (ref 150–400)
RBC: 4.06 MIL/uL (ref 3.87–5.11)
RDW: 16.2 % — ABNORMAL HIGH (ref 11.5–15.5)
WBC: 8.7 10*3/uL (ref 4.0–10.5)
nRBC: 0.3 % — ABNORMAL HIGH (ref 0.0–0.2)

## 2021-09-15 LAB — PROTIME-INR
INR: 1.1 (ref 0.8–1.2)
Prothrombin Time: 13.7 seconds (ref 11.4–15.2)

## 2021-09-15 LAB — URINE DRUG SCREEN, QUALITATIVE (ARMC ONLY)
Amphetamines, Ur Screen: POSITIVE — AB
Barbiturates, Ur Screen: NOT DETECTED
Benzodiazepine, Ur Scrn: NOT DETECTED
Cannabinoid 50 Ng, Ur ~~LOC~~: POSITIVE — AB
Cocaine Metabolite,Ur ~~LOC~~: POSITIVE — AB
MDMA (Ecstasy)Ur Screen: NOT DETECTED
Methadone Scn, Ur: NOT DETECTED
Opiate, Ur Screen: NOT DETECTED
Phencyclidine (PCP) Ur S: NOT DETECTED
Tricyclic, Ur Screen: NOT DETECTED

## 2021-09-15 LAB — URINALYSIS, ROUTINE W REFLEX MICROSCOPIC
Bilirubin Urine: NEGATIVE
Glucose, UA: NEGATIVE mg/dL
Hgb urine dipstick: NEGATIVE
Ketones, ur: NEGATIVE mg/dL
Nitrite: POSITIVE — AB
Protein, ur: 30 mg/dL — AB
Specific Gravity, Urine: 1.021 (ref 1.005–1.030)
pH: 7 (ref 5.0–8.0)

## 2021-09-15 LAB — STREP PNEUMONIAE URINARY ANTIGEN: Strep Pneumo Urinary Antigen: NEGATIVE

## 2021-09-15 LAB — LACTIC ACID, PLASMA: Lactic Acid, Venous: 0.6 mmol/L (ref 0.5–1.9)

## 2021-09-15 LAB — APTT: aPTT: 31 seconds (ref 24–36)

## 2021-09-15 LAB — TROPONIN I (HIGH SENSITIVITY)
Troponin I (High Sensitivity): 6 ng/L (ref <18)
Troponin I (High Sensitivity): 25 ng/L — ABNORMAL HIGH (ref <18)

## 2021-09-15 LAB — PHOSPHORUS: Phosphorus: 2 mg/dL — ABNORMAL LOW (ref 2.5–4.6)

## 2021-09-15 LAB — MRSA NEXT GEN BY PCR, NASAL: MRSA by PCR Next Gen: DETECTED — AB

## 2021-09-15 LAB — POC URINE PREG, ED: Preg Test, Ur: NEGATIVE

## 2021-09-15 LAB — CK: Total CK: 1903 U/L — ABNORMAL HIGH (ref 38–234)

## 2021-09-15 LAB — MAGNESIUM: Magnesium: 1.8 mg/dL (ref 1.7–2.4)

## 2021-09-15 MED ORDER — STERILE WATER FOR INJECTION IJ SOLN
INTRAMUSCULAR | Status: AC
  Administered 2021-09-15: 10 mL
  Filled 2021-09-15: qty 10

## 2021-09-15 MED ORDER — PROPOFOL 1000 MG/100ML IV EMUL
5.0000 ug/kg/min | INTRAVENOUS | Status: DC
  Administered 2021-09-15: 80 ug/kg/min via INTRAVENOUS
  Administered 2021-09-15: 60 ug/kg/min via INTRAVENOUS
  Administered 2021-09-15: 50 ug/kg/min via INTRAVENOUS
  Administered 2021-09-15: 74.914 ug/kg/min via INTRAVENOUS
  Administered 2021-09-16: 60 ug/kg/min via INTRAVENOUS
  Filled 2021-09-15 (×5): qty 100

## 2021-09-15 MED ORDER — DOCUSATE SODIUM 100 MG PO CAPS: 100.0000 mg | ORAL_CAPSULE | Freq: Two times a day (BID) | ORAL | Status: DC | PRN

## 2021-09-15 MED ORDER — MIDAZOLAM HCL 2 MG/2ML IJ SOLN
2.0000 mg | INTRAMUSCULAR | Status: DC | PRN
  Administered 2021-09-15: 2 mg via INTRAVENOUS

## 2021-09-15 MED ORDER — POTASSIUM PHOSPHATES 15 MMOLE/5ML IV SOLN
15.0000 mmol | Freq: Once | INTRAVENOUS | Status: AC
  Administered 2021-09-15: 15 mmol via INTRAVENOUS
  Filled 2021-09-15: qty 5

## 2021-09-15 MED ORDER — POTASSIUM CHLORIDE 10 MEQ/100ML IV SOLN
10.0000 meq | INTRAVENOUS | Status: AC
  Administered 2021-09-15 (×3): 10 meq via INTRAVENOUS
  Filled 2021-09-15 (×3): qty 100

## 2021-09-15 MED ORDER — VECURONIUM BROMIDE 10 MG IV SOLR: 10.0000 mg | Freq: Once | INTRAVENOUS | Status: AC

## 2021-09-15 MED ORDER — CHLORHEXIDINE GLUCONATE CLOTH 2 % EX PADS
6.0000 | MEDICATED_PAD | Freq: Every day | CUTANEOUS | Status: DC
  Administered 2021-09-15 – 2021-09-20 (×6): 6 via TOPICAL

## 2021-09-15 MED ORDER — MIDAZOLAM BOLUS VIA INFUSION
2.0000 mg | INTRAVENOUS | Status: DC | PRN
Start: 2021-09-15 — End: 2021-09-16
  Administered 2021-09-16 (×2): 2 mg via INTRAVENOUS

## 2021-09-15 MED ORDER — MIDAZOLAM HCL 2 MG/2ML IJ SOLN: 2.0000 mg | INTRAMUSCULAR | Status: DC | PRN

## 2021-09-15 MED ORDER — ETOMIDATE 2 MG/ML IV SOLN
INTRAVENOUS | Status: AC
  Administered 2021-09-15: 20 mg
  Filled 2021-09-15: qty 10

## 2021-09-15 MED ORDER — SUCCINYLCHOLINE CHLORIDE 200 MG/10ML IV SOSY
PREFILLED_SYRINGE | INTRAVENOUS | Status: AC
  Administered 2021-09-15: 150 mg
  Filled 2021-09-15: qty 10

## 2021-09-15 MED ORDER — VECURONIUM BROMIDE 10 MG IV SOLR
10.0000 mg | INTRAVENOUS | Status: DC | PRN
  Administered 2021-09-15: 10 mg via INTRAVENOUS

## 2021-09-15 MED ORDER — ORAL CARE MOUTH RINSE: 15.0000 mL | OROMUCOSAL | Status: DC | PRN

## 2021-09-15 MED ORDER — STERILE WATER FOR INJECTION IJ SOLN
INTRAMUSCULAR | Status: AC
  Filled 2021-09-15: qty 10

## 2021-09-15 MED ORDER — FENTANYL 2500MCG IN NS 250ML (10MCG/ML) PREMIX INFUSION
INTRAVENOUS | Status: AC
  Administered 2021-09-15: 100 ug/h
  Filled 2021-09-15: qty 250

## 2021-09-15 MED ORDER — MIDAZOLAM-SODIUM CHLORIDE 100-0.9 MG/100ML-% IV SOLN
0.5000 mg/h | INTRAVENOUS | Status: DC
  Administered 2021-09-15: 10 mg/h via INTRAVENOUS
  Administered 2021-09-16: 7 mg/h via INTRAVENOUS
  Administered 2021-09-16: 6 mg/h via INTRAVENOUS
  Administered 2021-09-17: 4 mg/h via INTRAVENOUS
  Filled 2021-09-15 (×4): qty 100

## 2021-09-15 MED ORDER — MAGNESIUM SULFATE 2 GM/50ML IV SOLN
2.0000 g | Freq: Once | INTRAVENOUS | Status: AC
  Administered 2021-09-15: 2 g via INTRAVENOUS

## 2021-09-15 MED ORDER — FENTANYL BOLUS VIA INFUSION
50.0000 ug | INTRAVENOUS | Status: DC | PRN
  Administered 2021-09-15 – 2021-09-16 (×2): 100 ug via INTRAVENOUS
  Administered 2021-09-16: 50 ug via INTRAVENOUS
  Administered 2021-09-16 – 2021-09-17 (×2): 100 ug via INTRAVENOUS

## 2021-09-15 MED ORDER — POLYETHYLENE GLYCOL 3350 17 G PO PACK
17.0000 g | PACK | Freq: Every day | ORAL | Status: DC
Start: 2021-09-15 — End: 2021-09-17
  Administered 2021-09-15 – 2021-09-17 (×3): 17 g
  Filled 2021-09-15 (×3): qty 1

## 2021-09-15 MED ORDER — MIDAZOLAM-SODIUM CHLORIDE 100-0.9 MG/100ML-% IV SOLN
INTRAVENOUS | Status: AC
  Administered 2021-09-15: 10 mg/h
  Filled 2021-09-15: qty 100

## 2021-09-15 MED ORDER — SODIUM CHLORIDE 0.9 % IV SOLN
3.0000 g | Freq: Four times a day (QID) | INTRAVENOUS | Status: DC
  Administered 2021-09-15 – 2021-09-19 (×17): 3 g via INTRAVENOUS
  Filled 2021-09-15: qty 3
  Filled 2021-09-15 (×3): qty 8
  Filled 2021-09-15 (×8): qty 3
  Filled 2021-09-15: qty 8
  Filled 2021-09-15 (×3): qty 3
  Filled 2021-09-15 (×3): qty 8

## 2021-09-15 MED ORDER — NOREPINEPHRINE 4 MG/250ML-% IV SOLN
INTRAVENOUS | Status: AC
  Filled 2021-09-15: qty 250

## 2021-09-15 MED ORDER — ORAL CARE MOUTH RINSE
15.0000 mL | OROMUCOSAL | Status: DC
  Administered 2021-09-15 – 2021-09-17 (×16): 15 mL via OROMUCOSAL

## 2021-09-15 MED ORDER — VECURONIUM BROMIDE 10 MG IV SOLR
INTRAVENOUS | Status: AC
  Administered 2021-09-15: 10 mg via INTRAVENOUS
  Filled 2021-09-15: qty 10

## 2021-09-15 MED ORDER — VECURONIUM BROMIDE 10 MG IV SOLR
INTRAVENOUS | Status: AC
  Filled 2021-09-15: qty 10

## 2021-09-15 MED ORDER — LACTATED RINGERS IV BOLUS
1000.0000 mL | Freq: Once | INTRAVENOUS | Status: AC
  Administered 2021-09-15: 1000 mL via INTRAVENOUS

## 2021-09-15 MED ORDER — SODIUM CHLORIDE 0.9 % IV BOLUS
1000.0000 mL | Freq: Once | INTRAVENOUS | Status: AC
  Administered 2021-09-15: 1000 mL via INTRAVENOUS

## 2021-09-15 MED ORDER — MORPHINE SULFATE (PF) 4 MG/ML IV SOLN
4.0000 mg | Freq: Once | INTRAVENOUS | Status: AC
  Administered 2021-09-15: 4 mg via INTRAMUSCULAR
  Filled 2021-09-15: qty 1

## 2021-09-15 MED ORDER — DOCUSATE SODIUM 50 MG/5ML PO LIQD
100.0000 mg | Freq: Two times a day (BID) | ORAL | Status: DC
  Administered 2021-09-15 – 2021-09-17 (×4): 100 mg
  Filled 2021-09-15 (×4): qty 10

## 2021-09-15 MED ORDER — ENOXAPARIN SODIUM 40 MG/0.4ML IJ SOSY
40.0000 mg | PREFILLED_SYRINGE | INTRAMUSCULAR | Status: DC
  Administered 2021-09-15: 40 mg via SUBCUTANEOUS
  Filled 2021-09-15: qty 0.4

## 2021-09-15 MED ORDER — MIDAZOLAM HCL 2 MG/2ML IJ SOLN
4.0000 mg | Freq: Once | INTRAMUSCULAR | Status: DC
  Filled 2021-09-15: qty 4

## 2021-09-15 MED ORDER — PROPOFOL 1000 MG/100ML IV EMUL
INTRAVENOUS | Status: AC
  Filled 2021-09-15: qty 100

## 2021-09-15 MED ORDER — FENTANYL 2500MCG IN NS 250ML (10MCG/ML) PREMIX INFUSION
50.0000 ug/h | INTRAVENOUS | Status: DC
  Administered 2021-09-15: 200 ug/h via INTRAVENOUS
  Administered 2021-09-16 (×2): 175 ug/h via INTRAVENOUS
  Administered 2021-09-17: 200 ug/h via INTRAVENOUS
  Filled 2021-09-15 (×6): qty 250

## 2021-09-15 MED ORDER — MIDAZOLAM HCL 2 MG/2ML IJ SOLN
4.0000 mg | Freq: Once | INTRAMUSCULAR | Status: AC
  Administered 2021-09-15: 4 mg via INTRAVENOUS

## 2021-09-15 MED ORDER — PANTOPRAZOLE 2 MG/ML SUSPENSION
40.0000 mg | Freq: Every day | ORAL | Status: DC
  Administered 2021-09-16: 40 mg
  Filled 2021-09-15 (×2): qty 20

## 2021-09-15 MED ORDER — ZIPRASIDONE MESYLATE 20 MG IM SOLR
10.0000 mg | Freq: Once | INTRAMUSCULAR | Status: AC
  Administered 2021-09-15: 10 mg via INTRAMUSCULAR
  Filled 2021-09-15: qty 20

## 2021-09-15 MED ORDER — SODIUM CHLORIDE 0.9 % IV SOLN: INTRAVENOUS | Status: DC

## 2021-09-15 MED ORDER — POLYETHYLENE GLYCOL 3350 17 G PO PACK: 17.0000 g | PACK | Freq: Every day | ORAL | Status: DC | PRN
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
## 2021-09-15 NOTE — ED Notes (Signed)
Lab contacted at this time to advise of need for assistance with blood cultures

## 2021-09-15 NOTE — ED Notes (Signed)
Pt removed from violent restraints at this time. Skin assessed, no breakdown to sites noted.

## 2021-09-15 NOTE — Progress Notes (Signed)
Pharmacy Antibiotic Note  Cassandra [name] is a 36 y.o. female admitted on 09/14/2021 with pneumonia.  Pharmacy has been consulted for Unasyn dosing.  Plan: Unasyn 3 gm IV Q6H ordered to start on 9/1 @ 0500.   Height: 5\' 2"  (157.5 cm) Weight: 48.5 kg (106 lb 14.8 oz) IBW/kg (Calculated) : 50.1  Temp (24hrs), Avg:98.8 F (37.1 C), Min:98.5 F (36.9 C), Max:99 F (37.2 C)  Recent Labs  Lab 09/14/21 2150  WBC 6.3  CREATININE 0.67    Estimated Creatinine Clearance: 74.4 mL/min (by C-G formula based on SCr of 0.67 mg/dL).    Allergies  Allergen Reactions   Metronidazole Nausea And Vomiting   Prednisone Other (See Comments)    irritable/ angry and very lethargic when finishes    Antimicrobials this admission:   >>    >>   Dose adjustments this admission:   Microbiology results:  BCx:   UCx:    Sputum:    MRSA PCR:   Thank you for allowing pharmacy to be a part of this patient's care.  Lumen Brinlee D 09/15/2021 4:55 AM

## 2021-09-15 NOTE — ED Notes (Signed)
Pt intubated with 7.5 ETT by MD Dolores Frame. 20 @ lip. + color change, bilateral breath sounds auscultated. ETT secured in place with commercial device

## 2021-09-15 NOTE — ED Notes (Signed)
Pt yelling out, getting out of bed, dropping herself to ground. Dolores Frame MD notified, orders pending

## 2021-09-15 NOTE — ED Notes (Addendum)
Right TLC with swelling at site - able to pull back blood, flush all ports. TLC seems to be in place. Dr Marisa Severin notified and to bedside.

## 2021-09-15 NOTE — IPAL (Signed)
  Interdisciplinary Goals of Care Family Meeting   Date carried out: 09/15/2021  Location of the meeting: Phone conference  Member's involved: Physician and Family Member or next of kin   GOALS OF CARE DISCUSSION  The Clinical status was relayed to family in detail-Mother Cassandra [name] at  [phone] Updated and notified of patients medical condition- Patient remains unresponsive and will not open eyes to command.   Patient is having a weak cough and struggling to remove secretions.   Patient with increased WOB and using accessory muscles to breathe Explained to family course of therapy and the modalities    PATIENT REMAINS FULL CODE  Family understands the situation. COCAINE AND AMPHETAMINE POISONING LEADING TO SEVERE ENCEPHALOPATHY LEADING TO ASPIRATION PNEUMONITIS   Family are satisfied with Plan of action and management. All questions answered  Additional CC time 30 mins   Raghav Verrilli Santiago Glad, M.D.  [facility]  Medical Director [facility] Medical Director [facility] Cardio-Pulmonary Department

## 2021-09-15 NOTE — ED Notes (Signed)
MD Dolores Frame at bedside at this time for intubation

## 2021-09-15 NOTE — Consult Note (Signed)
PHARMACY CONSULT NOTE - FOLLOW UP  Pharmacy Consult for Electrolyte Monitoring and Replacement   Recent Labs: Potassium (mmol/L)  Date Value  09/15/2021 3.3 (L)   Magnesium (mg/dL)  Date Value  58/52/7782 1.8   Calcium (mg/dL)  Date Value  42/35/3614 7.5 (L)   Albumin (g/dL)  Date Value  43/15/4008 3.5   Phosphorus (mg/dL)  Date Value  67/61/9509 2.0 (L)   Sodium (mmol/L)  Date Value  09/15/2021 137     Assessment: 36 y.o female with significant PMH of anxiety and depression, polysubstance abuse disorder, pyelonephritis, hypothyroidism, ADD, asthma, migraine headaches who presented to the ED on 8/31 with chief complaints of extreme agitation and possible drug withdrawals. On NS @ 125 ml/hr. Pt is intubated.   Goal of Therapy:  WNL  Plan:  Mg 2 g IV x 1 Kcl 10 mEq x 3 runs.  F/u with AM labs.   Ronnald Ramp ,PharmD Clinical Pharmacist 09/15/2021 8:38 AM

## 2021-09-15 NOTE — ED Notes (Signed)
Pt continues to be agitated, getting up from bed and yelling out.
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
## 2021-09-15 NOTE — H&P (Signed)
NAME:  Cassandra [name], MRN:  [id number], DOB:  November 25, 1985, LOS: 0 ADMISSION DATE:  09/14/2021, CONSULTATION DATE:  09/15/2021 REFERRING MD: Chiquita Loth, MD, CHIEF COMPLAINT:  Drug Induced Psychosis   HPI  36 y.o female with significant PMH of anxiety and depression, polysubstance abuse disorder, pyelonephritis, hypothyroidism, ADD, asthma, migraine headaches who presented to the ED on 8/31 with chief complaints of extreme agitation and possible drug withdrawals.  Per ED notes, patient arrived from home via [facility] screaming and yelling incoherently.  Patient reported to ED staff that she takes Buprenorphine and went to pick it up from the pharmacy but claimed she was dispensed the wrong medication.  Patient reported that she thinks she is going through withdrawals.  She arrived with bottles of Lexapro and amphetamine salts which apparently is not prescribed to her.  Patient admitted to taking methamphetamine and fentanyl the previous day.  ED Course: In the emergency department, vital signs showed blood pressure of 120/85 mm Hg, tachycardia of 106 beats per minute, a respiratory rate of 22 breaths per minute, and a temperature of 98.14F (36.9C), sats 100% on RA.  Patient was extremely agitated, sitting on the bathroom floor and experiencing internal and external stimuli.  She apparently was also exhibiting some delusional thinking but still able to answer questions appropriately.  Due to  acute presentation with potential threat to life or bodily function patient was placed under involuntary commitment.  She was evaluated by psych for psychotic and paranoid behaviors.  While still holding down in the ED pending inpatient psych placement, patient went into acute paranoia screaming and yelling while seated on the floor. Patient required several doses of IV lorazepam, 4 mg of Versed, morphine 4 mg, Haldol 5 mg, 25 mg Benadryl IM, droperidol 5 mg and Geodon with no improvement.  Patient continued to thrash about  in bed extremely agitated and combative.  She was placed on four-point restraint however this was not adequate to hold her down therefore decision was made to intubate and sedate her for safety reasons.  PCCM consulted  Pertinent Labs/Diagnostics Findings: Chemistry:Na+/ K+:  Glucose: BUN/Cr.:Calcium:  AST/ALT: CBC: WBC: 8.7Hgb/Hct:9.5/31.1  Other Lab findings ABG result:  pO2 192; pCO2 33; pH 7.47;  HCO3 24, %O2 Sat 99.8.  UDS:+ Amphetamines, cocaine metabolites, and cannabinoid Imaging:  CXR> no acute cardiopulmonary process CTH> no acute intracranial abnormality   Past Medical History  Anxiety and Depression, polysubstance abuse disorder, pyelonephritis, hypothyroidism, ADD, asthma, migraine headaches  Significant Hospital Events   8/31 9/1:Admitted to ICU with drug induced psychosis and respiratory failure requiring mechanical ventilation.  Consults:  Psychiatric  Procedures:  9/1: Intubation 9/1: Right IJ central line  Significant Diagnostic Tests:  9/1: Chest Xray> no acute cardiopulmonary process 9/1: Abdominal xray>1. Gas-filled but nondilated small and large bowel loops may indicate ileus. 8/31: Noncontrast CT head> no acute intracranial abnormality  Micro Data:  9/1: Blood culture x2> 9/1: Urine Culture> 9/1: MRSA PCR>>  9/1: Strep pneumo urinary antigen> 9/1: Legionella urinary antigen>   Antimicrobials:  Unaysn 9/1>  OBJECTIVE  Blood pressure 120/85, pulse (!) 106, temperature 98.5 F (36.9 C), temperature source Oral, resp. rate (!) 22, height 5\' 2"  (1.575 m), weight 48.5 kg, SpO2 100 %, not currently breastfeeding.    Vent Mode: AC FiO2 (%):  [40 %] 40 % Set Rate:  [18 bmp] 18 bmp Vt Set:  [450 mL] 450 mL PEEP:  [5 cmH20] 5 cmH20   Intake/Output Summary (Last 24 hours) at 09/15/2021  0867 Last data filed at 09/15/2021 0425 Gross per 24 hour  Intake 1000 ml  Output --  Net 1000 ml   Filed Weights   09/14/21 2139  Weight: 48.5 kg     Physical  Examination  GENERAL: 36 year-old critically ill patient lying in the bed intubated mechanically ventilated and sedated EYES: Pupils equal, round, reactive to light and accommodation. No scleral icterus. Extraocular muscles intact.  HEENT: Head atraumatic, normocephalic. Oropharynx and nasopharynx clear.  NECK:  Supple, no jugular venous distention. No thyroid enlargement, no tenderness.  LUNGS: Normal breath sounds bilaterally, no wheezing, rales,rhonchi or crepitation. No use of accessory muscles of respiration.  CARDIOVASCULAR: S1, S2 normal. No murmurs, rubs, or gallops.  ABDOMEN: Soft, nontender, nondistended. Bowel sounds present. No organomegaly or mass.  EXTREMITIES: No pedal edema, cyanosis, or clubbing.  NEUROLOGIC: Cranial nerves II through XII are intact.  Moves all extremities spontaneously. Sensation intact. Gait not checked.  PSYCHIATRIC: The patient is intubated and sedated SKIN: No obvious rash, lesion, or ulcer.   Labs/imaging that I havepersonally reviewed  (right click and "Reselect all SmartList Selections" daily)     Labs   CBC: Recent Labs  Lab 09/14/21 2150  WBC 6.3  HGB 12.5  HCT 41.9  MCV 78.3*  PLT 566*    Basic Metabolic Panel: Recent Labs  Lab 09/14/21 2150  NA 137  K 4.4  CL 104  CO2 25  GLUCOSE 97  BUN 18  CREATININE 0.67  CALCIUM 8.9   GFR: Estimated Creatinine Clearance: 74.4 mL/min (by C-G formula based on SCr of 0.67 mg/dL). Recent Labs  Lab 09/14/21 2150  WBC 6.3    Liver Function Tests: Recent Labs  Lab 09/14/21 2150  AST 36  ALT 17  ALKPHOS 70  BILITOT 0.9  PROT 8.4*  ALBUMIN 4.3   No results for input(s): "LIPASE", "AMYLASE" in the last 168 hours. No results for input(s): "AMMONIA" in the last 168 hours.  ABG No results found for: "PHART", "PCO2ART", "PO2ART", "HCO3", "TCO2", "ACIDBASEDEF", "O2SAT"   Coagulation Profile: No results for input(s): "INR", "PROTIME" in the last 168 hours.  Cardiac  Enzymes: Recent Labs  Lab 09/14/21 2150  CKTOTAL 192    HbA1C: No results found for: "HGBA1C"  CBG: No results for input(s): "GLUCAP" in the last 168 hours.  Review of Systems:   Unable to assess patient is currently intubated and sedated  Past Medical History  She,  has a past medical history of ADHD (attention deficit hyperactivity disorder), Anxiety, Asthma, and Kidney stone.   Surgical History    Past Surgical History:  Procedure Laterality Date   ELBOW FRACTURE SURGERY     EYE MUSCLE SURGERY Bilateral    OVARIAN CYST REMOVAL Right    TONSILLECTOMY       Social History   reports that she has been smoking cigarettes. She has been smoking an average of .5 packs per day. She has never used smokeless tobacco. She reports that she does not currently use alcohol. She reports that she does not currently use drugs.   Family History   Her family history is not on file.   Allergies Allergies  Allergen Reactions   Metronidazole Nausea And Vomiting   Prednisone Other (See Comments)    irritable/ angry and very lethargic when finishes     Home Medications  Prior to Admission medications   Medication Sig Start Date End Date Taking? Authorizing Provider  ADDERALL XR 30 MG 24 hr capsule Take 30  mg by mouth 2 (two) times daily. 09/01/20   [provider]  buprenorphine (SUBUTEX) 8 MG SUBL SL tablet Place 8 mg under the tongue 2 (two) times daily. 09/09/20   [provider]  buPROPion (WELLBUTRIN SR) 150 MG 12 hr tablet Take 150 mg by mouth every morning. 08/21/20   [provider]  escitalopram (LEXAPRO) 20 MG tablet Take 20 mg by mouth daily. 09/01/20   [provider]  etonogestrel-ethinyl estradiol (NUVARING) 0.12-0.015 MG/24HR vaginal ring Insert vaginally and leave in place for 3 consecutive weeks, then remove for 1 week. 10/31/20   Vena Austria, MD  ibuprofen (ADVIL) 600 MG tablet Take 1 tablet (600 mg total) by mouth every 6 (six)  hours. 09/19/20   Mirna Mires, CNM  methylergonovine (METHERGINE) 0.2 MG tablet Take 1 tablet (0.2 mg total) by mouth 3 (three) times daily. 10/01/20   Nadara Mustard, MD  nitrofurantoin, macrocrystal-monohydrate, (MACROBID) 100 MG capsule Take 1 capsule (100 mg total) by mouth every 12 (twelve) hours. Patient not taking: Reported on 10/31/2020 10/01/20   Nadara Mustard, MD  promethazine (PHENERGAN) 25 MG suppository Place rectally. 10/27/20   [provider]  traZODone (DESYREL) 100 MG tablet Take 200 mg by mouth at bedtime as needed. 09/13/20   [provider]    Scheduled Meds:  buprenorphine-naloxone  1 tablet Sublingual Daily   docusate  100 mg Per Tube BID   pantoprazole  40 mg Per Tube Daily   polyethylene glycol  17 g Per Tube Daily   Continuous Infusions:  ampicillin-sulbactam (UNASYN) IV     fentaNYL infusion INTRAVENOUS 125 mcg/hr (09/15/21 0447)   midazolam 10 mg/hr (09/15/21 0335)   norepinephrine     propofol (DIPRIVAN) infusion     PRN Meds:.docusate sodium, fentaNYL, midazolam, midazolam, norepinephrine, polyethylene glycol, vecuronium   Active Hospital Problem list     Assessment & Plan:    Acute Hypoxic Respiratory Failure in the setting of Acute Drug Intoxication and possible Aspiration Event -Wean PEEP and FiO2 for sats greater than 90% -Plateau pressures less than 30 cm H20 -Con't LTVV, Vt decreased due to high Pplat and rate increased.  -Vecuronium PRN for significant vent dyssynchrony -VAP bundle in place -Intermittent chest x-ray & ABG -F/u cultures, trend PCT -Continue Aspiration Pna coverage: Unasyn -wean sedation/analgesia for RASS goal 0  Toxic-Metabolic Encephalopathy Secondary to Drug Intoxication Drug Induced Psychosis  Hx: ADD, polysubstance abuse, opiate dependence on Subutex, Wellbutrin UDS + cocaine, amphetamine and cannabinoid.  Patient admitted to taking fentanyl and meth as well -Provide supportive care -Promote  normal sleep/wake cycle -Avoid sedating meds as able -CT Head negative for acute intracranial abnormality -seizure precautions -monitor CBG q 4 -will need sitter/ SI precautions and psych follow up after acute illness/ extubation  Acute rhabdomyolysis Hypokalemia -Trend CK -Monitor I&O's / urinary output -Follow BMP -Aggressive IVFs hydration -Avoid nephrotoxic agents as able -Replace electrolytes as indicated  Hypothyroidism -Check TSH, Free T4   Anxiety and depression Hold Lexapro, Wellbutrin and trazodone for now  Best practice:  Diet:  NPO Pain/Anxiety/Delirium protocol (if indicated): Yes (RASS goal 0) VAP protocol (if indicated): Yes DVT prophylaxis: Contraindicated GI prophylaxis: H2B Glucose control:  SSI Yes Central venous access:  Yes, and it is still needed Arterial line:  N/A Foley:  Yes, and it is still needed Mobility:  bed rest  PT consulted: Yes Last date of multidisciplinary goals of care discussion [9/1] Code Status:  full code Disposition: ICU   =  Goals of Care = Code Status Order: FULL  Primary Emergency Contact: Dremel,Pascal Wishes to pursue full aggressive treatment and intervention options, including CPR and intubation, but goals of care will be addressed on going with family if that should become necessary.  Critical care time: 45 minutes        Webb Silversmith, DNP, CCRN, FNP-C, AGACNP-BC Acute Care Nurse Practitioner [facility]  PCCM on call pager [phone] until 7 am

## 2021-09-16 ENCOUNTER — Inpatient Hospital Stay: Payer: Medicaid Other

## 2021-09-16 DIAGNOSIS — F419 Anxiety disorder, unspecified: Secondary | ICD-10-CM | POA: Diagnosis not present

## 2021-09-16 DIAGNOSIS — R451 Restlessness and agitation: Secondary | ICD-10-CM | POA: Diagnosis not present

## 2021-09-16 DIAGNOSIS — F32A Depression, unspecified: Secondary | ICD-10-CM | POA: Diagnosis not present

## 2021-09-16 DIAGNOSIS — F1510 Other stimulant abuse, uncomplicated: Secondary | ICD-10-CM | POA: Diagnosis not present

## 2021-09-16 DIAGNOSIS — R000 Tachycardia, unspecified: Secondary | ICD-10-CM

## 2021-09-16 LAB — OCCULT BLOOD GASTRIC / DUODENUM (SPECIMEN CUP)
Occult Blood, Gastric: POSITIVE — AB
pH, Gastric: 1

## 2021-09-16 LAB — BASIC METABOLIC PANEL
Anion gap: 3 — ABNORMAL LOW (ref 5–15)
BUN: 8 mg/dL (ref 6–20)
CO2: 22 mmol/L (ref 22–32)
Calcium: 6.8 mg/dL — ABNORMAL LOW (ref 8.9–10.3)
Chloride: 116 mmol/L — ABNORMAL HIGH (ref 98–111)
Creatinine, Ser: 0.43 mg/dL — ABNORMAL LOW (ref 0.44–1.00)
GFR, Estimated: >60 mL/min (ref >60)
Glucose, Bld: 84 mg/dL (ref 70–99)
Potassium: 3.2 mmol/L — ABNORMAL LOW (ref 3.5–5.1)
Sodium: 141 mmol/L (ref 135–145)

## 2021-09-16 LAB — MAGNESIUM: Magnesium: 2.1 mg/dL (ref 1.7–2.4)

## 2021-09-16 LAB — CBC
HCT: 25.4 % — ABNORMAL LOW (ref 36.0–46.0)
Hemoglobin: 7.8 g/dL — ABNORMAL LOW (ref 12.0–15.0)
MCH: 23.6 pg — ABNORMAL LOW (ref 26.0–34.0)
MCHC: 30.7 g/dL (ref 30.0–36.0)
MCV: 76.7 fL — ABNORMAL LOW (ref 80.0–100.0)
Platelets: 397 10*3/uL (ref 150–400)
RBC: 3.31 MIL/uL — ABNORMAL LOW (ref 3.87–5.11)
RDW: 16.8 % — ABNORMAL HIGH (ref 11.5–15.5)
WBC: 8.3 10*3/uL (ref 4.0–10.5)
nRBC: 0 % (ref 0.0–0.2)

## 2021-09-16 LAB — HEMOGLOBIN AND HEMATOCRIT, BLOOD
HCT: 24.7 % — ABNORMAL LOW (ref 36.0–46.0)
HCT: 23.4 % — ABNORMAL LOW (ref 36.0–46.0)
HCT: 25.6 % — ABNORMAL LOW (ref 36.0–46.0)
Hemoglobin: 7.5 g/dL — ABNORMAL LOW (ref 12.0–15.0)
Hemoglobin: 7.2 g/dL — ABNORMAL LOW (ref 12.0–15.0)
Hemoglobin: 7.9 g/dL — ABNORMAL LOW (ref 12.0–15.0)

## 2021-09-16 LAB — HEPATIC FUNCTION PANEL
ALT: 15 U/L (ref 0–44)
AST: 29 U/L (ref 15–41)
Albumin: 2.6 g/dL — ABNORMAL LOW (ref 3.5–5.0)
Alkaline Phosphatase: 49 U/L (ref 38–126)
Bilirubin, Direct: <0.1 mg/dL (ref 0.0–0.2)
Total Bilirubin: 0.6 mg/dL (ref 0.3–1.2)
Total Protein: 5 g/dL — ABNORMAL LOW (ref 6.5–8.1)

## 2021-09-16 LAB — TRIGLYCERIDES: Triglycerides: 128 mg/dL (ref <150)

## 2021-09-16 LAB — GLUCOSE, CAPILLARY
Glucose-Capillary: 128 mg/dL — ABNORMAL HIGH (ref 70–99)
Glucose-Capillary: 58 mg/dL — ABNORMAL LOW (ref 70–99)
Glucose-Capillary: 75 mg/dL (ref 70–99)
Glucose-Capillary: 80 mg/dL (ref 70–99)
Glucose-Capillary: 83 mg/dL (ref 70–99)
Glucose-Capillary: 83 mg/dL (ref 70–99)
Glucose-Capillary: 83 mg/dL (ref 70–99)
Glucose-Capillary: 91 mg/dL (ref 70–99)

## 2021-09-16 LAB — POTASSIUM
Potassium: 5.9 mmol/L — ABNORMAL HIGH (ref 3.5–5.1)
Potassium: 3.9 mmol/L (ref 3.5–5.1)

## 2021-09-16 LAB — PHOSPHORUS
Phosphorus: 2.1 mg/dL — ABNORMAL LOW (ref 2.5–4.6)
Phosphorus: 8 mg/dL — ABNORMAL HIGH (ref 2.5–4.6)
Phosphorus: 2.7 mg/dL (ref 2.5–4.6)

## 2021-09-16 LAB — CK: Total CK: 672 U/L — ABNORMAL HIGH (ref 38–234)

## 2021-09-16 MED ORDER — DEXMEDETOMIDINE HCL IN NACL 400 MCG/100ML IV SOLN
0.4000 ug/kg/h | INTRAVENOUS | Status: DC
  Administered 2021-09-16: 0.4 ug/kg/h via INTRAVENOUS
  Administered 2021-09-16: 0.8 ug/kg/h via INTRAVENOUS
  Administered 2021-09-17: 1.2 ug/kg/h via INTRAVENOUS
  Administered 2021-09-17: 1 ug/kg/h via INTRAVENOUS
  Filled 2021-09-16 (×3): qty 100

## 2021-09-16 MED ORDER — PROPOFOL 1000 MG/100ML IV EMUL
5.0000 ug/kg/min | INTRAVENOUS | Status: DC
  Administered 2021-09-16: 60 ug/kg/min via INTRAVENOUS
  Administered 2021-09-16: 30 ug/kg/min via INTRAVENOUS
  Administered 2021-09-16: 50 ug/kg/min via INTRAVENOUS
  Administered 2021-09-17: 70 ug/kg/min via INTRAVENOUS
  Filled 2021-09-16 (×3): qty 100

## 2021-09-16 MED ORDER — LORAZEPAM 2 MG/ML IJ SOLN: 1.0000 mg | INTRAMUSCULAR | Status: DC | PRN

## 2021-09-16 MED ORDER — MIDAZOLAM BOLUS VIA INFUSION
2.0000 mg | INTRAVENOUS | Status: DC | PRN
  Administered 2021-09-16: 4 mg via INTRAVENOUS
  Administered 2021-09-17: 2 mg via INTRAVENOUS

## 2021-09-16 MED ORDER — DEXTROSE 5 % IV SOLN
15.0000 mmol | Freq: Once | INTRAVENOUS | Status: AC
  Administered 2021-09-16: 15 mmol via INTRAVENOUS
  Filled 2021-09-16: qty 5

## 2021-09-16 MED ORDER — PANTOPRAZOLE INFUSION (NEW) - SIMPLE MED
8.0000 mg/h | INTRAVENOUS | Status: DC
  Administered 2021-09-16 (×2): 8 mg/h via INTRAVENOUS
  Filled 2021-09-16 (×3): qty 100

## 2021-09-16 MED ORDER — POTASSIUM CHLORIDE 20 MEQ PO PACK
40.0000 meq | PACK | Freq: Once | ORAL | Status: AC
  Administered 2021-09-16: 40 meq
  Filled 2021-09-16: qty 2

## 2021-09-16 MED ORDER — ALBUMIN HUMAN 25 % IV SOLN
25.0000 g | Freq: Four times a day (QID) | INTRAVENOUS | Status: AC
  Administered 2021-09-16 (×2): 25 g via INTRAVENOUS
  Filled 2021-09-16 (×2): qty 100

## 2021-09-16 MED ORDER — PANTOPRAZOLE 80MG IVPB - SIMPLE MED
80.0000 mg | Freq: Once | INTRAVENOUS | Status: AC
  Administered 2021-09-16: 80 mg via INTRAVENOUS
  Filled 2021-09-16: qty 100

## 2021-09-16 MED ORDER — PANTOPRAZOLE SODIUM 40 MG IV SOLR: 40.0000 mg | Freq: Two times a day (BID) | INTRAVENOUS | Status: DC

## 2021-09-16 MED ORDER — PHENOBARBITAL 32.4 MG PO TABS: 32.4000 mg | ORAL_TABLET | Freq: Three times a day (TID) | ORAL | Status: DC

## 2021-09-16 MED ORDER — PHENOBARBITAL 32.4 MG PO TABS
64.8000 mg | ORAL_TABLET | Freq: Three times a day (TID) | ORAL | Status: DC
  Administered 2021-09-16 – 2021-09-17 (×3): 64.8 mg
  Filled 2021-09-16 (×4): qty 2

## 2021-09-16 MED ORDER — DEXTROSE 50 % IV SOLN
INTRAVENOUS | Status: AC
  Filled 2021-09-16: qty 50

## 2021-09-16 MED ORDER — PROPOFOL 1000 MG/100ML IV EMUL
INTRAVENOUS | Status: AC
  Filled 2021-09-16: qty 100

## 2021-09-16 MED ORDER — DEXTROSE 50 % IV SOLN
12.5000 g | INTRAVENOUS | Status: AC
  Administered 2021-09-16: 12.5 g via INTRAVENOUS

## 2021-09-16 NOTE — Progress Notes (Addendum)
NAME:  Cassandra [name], MRN:  [id number], DOB:  March 20, 1985, LOS: 1 ADMISSION DATE:  09/14/2021, CONSULTATION DATE:  09/15/2021 REFERRING MD: Chiquita Loth, MD, CHIEF COMPLAINT:  Drug Induced Psychosis   HPI  35 y.o female with significant PMH of anxiety and depression, polysubstance abuse disorder, pyelonephritis, hypothyroidism, ADD, asthma, migraine headaches who presented to the ED on 8/31 with chief complaints of extreme agitation and possible drug withdrawals.  Per ED notes, patient arrived from home via [facility] screaming and yelling incoherently.  Patient reported to ED staff that she takes Buprenorphine and went to pick it up from the pharmacy but claimed she was dispensed the wrong medication.  Patient reported that she thinks she is going through withdrawals.  She arrived with bottles of Lexapro and amphetamine salts which apparently is not prescribed to her.  Patient admitted to taking methamphetamine and fentanyl the previous day.  ED Course: In the emergency department, vital signs showed blood pressure of 120/85 mm Hg, tachycardia of 106 beats per minute, a respiratory rate of 22 breaths per minute, and a temperature of 98.52F (36.9C), sats 100% on RA.  Patient was extremely agitated, sitting on the bathroom floor and experiencing internal and external stimuli.  She apparently was also exhibiting some delusional thinking but still able to answer questions appropriately.  Due to  acute presentation with potential threat to life or bodily function patient was placed under involuntary commitment.  She was evaluated by psych for psychotic and paranoid behaviors.  While still holding down in the ED pending inpatient psych placement, patient went into acute paranoia screaming and yelling while seated on the floor. Patient required several doses of IV lorazepam, 4 mg of Versed, morphine 4 mg, Haldol 5 mg, 25 mg Benadryl IM, droperidol 5 mg and Geodon with no improvement.  Patient continued to thrash about  in bed extremely agitated and combative.  She was placed on four-point restraint however this was not adequate to hold her down therefore decision was made to intubate and sedate her for safety reasons.  PCCM consulted  Pertinent Labs/Diagnostics Findings: Chemistry:Na+/ K+:  Glucose: BUN/Cr.:Calcium:  AST/ALT: CBC: WBC: 8.7Hgb/Hct:9.5/31.1  Other Lab findings ABG result:  pO2 192; pCO2 33; pH 7.47;  HCO3 24, %O2 Sat 99.8.  UDS:+ Amphetamines, cocaine metabolites, and cannabinoid Imaging:  CXR> no acute cardiopulmonary process CTH> no acute intracranial abnormality  Past Medical History  Anxiety and Depression, polysubstance abuse disorder, pyelonephritis, hypothyroidism, ADD, asthma, migraine headaches  Significant Hospital Events   9/1:Admitted to ICU with drug induced psychosis and respiratory failure requiring mechanical ventilation. 9/2: WUA performed pt remains severely agitated/delirious with precedex gtt will start phenobarbital taper and resume versed/fentanyl/propofol gtts   Consults:  PCCM  Procedures:  9/1: Intubation 9/1: Right IJ central line  Significant Diagnostic Tests:  9/1: Chest Xray> no acute cardiopulmonary process 9/1: Abdominal xray>Gas-filled but nondilated small and large bowel loops may indicate ileus. 8/31: Noncontrast CT head> no acute intracranial abnormality  Micro Data:  9/1: Blood culture x2>negative  9/1: MRSA PCR>>positive  9/1: Strep pneumo urinary antigen>negative   Antimicrobials:  Unaysn 9/1>  OBJECTIVE  Blood pressure 106/81, pulse 82, temperature 98.5 F (36.9 C), temperature source Oral, resp. rate 18, height 5\' 2"  (1.575 m), weight 41.9 kg, SpO2 100 %, not currently breastfeeding.    Vent Mode: PRVC FiO2 (%):  [28 %] 28 % Set Rate:  [18 bmp] 18 bmp Vt Set:  [450 mL] 450 mL PEEP:  [5 cmH20] 5 cmH20 Plateau  Pressure:  [15 cmH20] 15 cmH20   Intake/Output Summary (Last 24 hours) at 09/16/2021 0721 Last data filed at 09/16/2021  0645 Gross per 24 hour  Intake 5080.85 ml  Output 2450 ml  Net 2630.85 ml   Filed Weights   09/14/21 2139 09/16/21 0425  Weight: 48.5 kg 41.9 kg     Physical Examination  GENERAL: Acutely-ill appearing female, NAD mechanically intubated  HEENT: Head atraumatic, normocephalic. Oropharynx and nasopharynx clear. Supple, no JVD  LUNGS: Faint rhonchi throughout, even, non labored  CARDIOVASCULAR: NSR, rrr, no r/g, 2+ radial/1+ distal pulses, 1+ bilateral lower extremity edema  ABDOMEN: +BS x4, soft, non tender, non distended, coffee ground emesis present in OGT EXTREMITIES: Normal bulk and tone, moves all extremities  NEUROLOGIC: Sedated, not following commands, withdraws from stimulation, moves all extremities, agitated/delirious  SKIN: No obvious rash, lesion, or ulcer.   Labs/imaging that I havepersonally reviewed  (right click and "Reselect all SmartList Selections" daily)     Labs   CBC: Recent Labs  Lab 09/14/21 2150 09/15/21 0558 09/16/21 0419  WBC 6.3 8.7 8.3  HGB 12.5 9.5* 7.8*  HCT 41.9 31.1* 25.4*  MCV 78.3* 76.6* 76.7*  PLT 566* 294 [id number]    Basic Metabolic Panel: Recent Labs  Lab 09/14/21 2150 09/15/21 0457 09/16/21 0419  NA 137 137 141  K 4.4 3.3* 3.2*  CL 104 109 116*  CO2 25 22 22   GLUCOSE 97 110* 84  BUN 18 11 8   CREATININE 0.67 0.41* 0.43*  CALCIUM 8.9 7.5* 6.8*  MG  --  1.8 2.1  PHOS  --  2.0* 2.1*   GFR: Estimated Creatinine Clearance: 64.3 mL/min (A) (by C-G formula based on SCr of 0.43 mg/dL (L)). Recent Labs  Lab 09/14/21 2150 09/15/21 0457 09/15/21 0558 09/16/21 0419  WBC 6.3  --  8.7 8.3  LATICACIDVEN  --  0.6  --   --     Liver Function Tests: Recent Labs  Lab 09/14/21 2150 09/15/21 0457 09/16/21 0419  AST 36 46* 29  ALT 17 21 15   ALKPHOS 70 53 49  BILITOT 0.9 0.7 0.6  PROT 8.4* 6.6 5.0*  ALBUMIN 4.3 3.5 2.6*   No results for input(s): "LIPASE", "AMYLASE" in the last 168 hours. No results for input(s): "AMMONIA" in  the last 168 hours.  ABG    Component Value Date/Time   PHART 7.47 (H) 09/15/2021 0457   PCO2ART 33 09/15/2021 0457   PO2ART 192 (H) 09/15/2021 0457   HCO3 24.0 09/15/2021 0457   O2SAT 99.8 09/15/2021 0457     Coagulation Profile: Recent Labs  Lab 09/15/21 0457  INR 1.1    Cardiac Enzymes: Recent Labs  Lab 09/14/21 2150 09/15/21 0457 09/16/21 0419  CKTOTAL 192 1,903* 672*    HbA1C: No results found for: "HGBA1C"  CBG: Recent Labs  Lab 09/15/21 1137 09/15/21 1933 09/16/21 0128 09/16/21 0408  GLUCAP 80 81 83 83    Review of Systems:   Unable to assess patient is currently intubated and sedated  Past Medical History  She,  has a past medical history of ADHD (attention deficit hyperactivity disorder), Anxiety, Asthma, and Kidney stone.   Surgical History    Past Surgical History:  Procedure Laterality Date   ELBOW FRACTURE SURGERY     EYE MUSCLE SURGERY Bilateral    OVARIAN CYST REMOVAL Right    TONSILLECTOMY       Social History   reports that she has been smoking cigarettes. She has been  smoking an average of .5 packs per day. She has never used smokeless tobacco. She reports that she does not currently use alcohol. She reports that she does not currently use drugs.   Family History   Her family history is not on file.   Allergies Allergies  Allergen Reactions   Metronidazole Nausea And Vomiting   Prednisone Other (See Comments)    irritable/ angry and very lethargic when finishes     Home Medications  Prior to Admission medications   Medication Sig Start Date End Date Taking? Authorizing Provider  ADDERALL XR 30 MG 24 hr capsule Take 30 mg by mouth 2 (two) times daily. 09/01/20   [provider]  buprenorphine (SUBUTEX) 8 MG SUBL SL tablet Place 8 mg under the tongue 2 (two) times daily. 09/09/20   [provider]  buPROPion (WELLBUTRIN SR) 150 MG 12 hr tablet Take 150 mg by mouth every morning. 08/21/20   [provider]  escitalopram (LEXAPRO) 20 MG tablet Take 20 mg by mouth daily. 09/01/20   [provider]  etonogestrel-ethinyl estradiol (NUVARING) 0.12-0.015 MG/24HR vaginal ring Insert vaginally and leave in place for 3 consecutive weeks, then remove for 1 week. 10/31/20   Vena Austria, MD  ibuprofen (ADVIL) 600 MG tablet Take 1 tablet (600 mg total) by mouth every 6 (six) hours. 09/19/20   Mirna Mires, CNM  methylergonovine (METHERGINE) 0.2 MG tablet Take 1 tablet (0.2 mg total) by mouth 3 (three) times daily. 10/01/20   Nadara Mustard, MD  nitrofurantoin, macrocrystal-monohydrate, (MACROBID) 100 MG capsule Take 1 capsule (100 mg total) by mouth every 12 (twelve) hours. Patient not taking: Reported on 10/31/2020 10/01/20   Nadara Mustard, MD  promethazine (PHENERGAN) 25 MG suppository Place rectally. 10/27/20   [provider]  traZODone (DESYREL) 100 MG tablet Take 200 mg by mouth at bedtime as needed. 09/13/20   [provider]    Scheduled Meds:  Chlorhexidine Gluconate Cloth  6 each Topical Daily   docusate  100 mg Per Tube BID   enoxaparin (LOVENOX) injection  40 mg Subcutaneous Q24H   mouth rinse  15 mL Mouth Rinse Q2H   pantoprazole  40 mg Per Tube Daily   polyethylene glycol  17 g Per Tube Daily   Continuous Infusions:  sodium chloride Stopped (09/15/21 2158)   albumin human 60 mL/hr at 09/16/21 0645   ampicillin-sulbactam (UNASYN) IV Stopped (09/16/21 0603)   fentaNYL infusion INTRAVENOUS 175 mcg/hr (09/16/21 0645)   midazolam 7 mg/hr (09/16/21 0645)   potassium PHOSPHATE IVPB (in mmol) 43 mL/hr at 09/16/21 0645   propofol (DIPRIVAN) infusion 60 mcg/kg/min (09/16/21 0645)   PRN Meds:.docusate sodium, fentaNYL, midazolam, mouth rinse, polyethylene glycol, vecuronium   Active Hospital Problem list     Assessment & Plan:    Acute Hypoxic Respiratory Failure in the setting of Acute Drug Intoxication and possible Aspiration Event - Wean PEEP and  FiO2 for sats greater than 90% - Plateau pressures less than 30 cm H20 - Con't LTVV, Vt decreased due to high Pplat and rate increased.  - VAP bundle in place - Intermittent chest x-ray & ABG - F/u cultures, trend PCT - Continue Aspiration Pna coverage: Unasyn - Wean sedation/analgesia for RASS goal 0 to -1 - PAD protocol: Continue propofol/versed/fentanyl gtts; will add phenobarbital taper and precedex gtts in an attempt to wean down versed/fentanyl/propofol gtts   Toxic-Metabolic Encephalopathy secondary to Drug Intoxication Drug Induced Psychosis  Hx: ADD, polysubstance abuse, opiate  dependence on Subutex, Wellbutrin  CT Head negative for acute intracranial abnormality - Seizure precautions  - Continue supportive care  - Once extubated will need Psychiatry consult and sitter at bedside pt currently Involuntarily Committed  - Polysubstance abuse cessation counseling once extubated   Acute rhabdomyolysis~improving  Hypokalemia Hypophosphatemia  - Trend BMP  - Replace electrolytes as indicated  - Monitor UOP - Avoid nephrotoxic medications   Acute blood loss anemia: Coffee ground emesis per OGT  - H&H q6hrs  - Gastric occult blood pending  - Monitor for s/sx of bleeding and transfuse for hgb <7 - Avoid chemical VTE px; subq lovenox discontinued  - Will start protonix gtt  - If pt continues to have acute blood loss anemia following discontinuation of lovenox in the next 24hrs will consult GI   Anxiety and depression - Hold Lexapro, Wellbutrin and trazodone for now  Best practice:  Diet:  NPO Pain/Anxiety/Delirium protocol (if indicated): Yes (RASS goal 0) VAP protocol (if indicated): Yes DVT prophylaxis: Contraindicated GI prophylaxis: Protonix gtt  Glucose control: N/A Central venous access:  Yes, and it is still needed Arterial line:  N/A Foley:  Yes, and it is still needed Mobility:  bed rest  PT consulted: NO Last date of multidisciplinary goals of care discussion  [9/1] Code Status:  full code Disposition: ICU  Updated pts mother Girtha Hake via telephone regarding pts condition and current plan of care all questions were answered   Critical care time: 40 minutes     Zada Girt, AGNP  Pulmonary/Critical Care Pager [phone] (please enter 7 digits) PCCM Consult Pager [phone] (please enter 7 digits)

## 2021-09-16 NOTE — Progress Notes (Signed)
Pt became very agitated during WUA flailing all over bed and reaching for ETT. Would not consistently follow commands. Very difficult to re-sedate. Precedex started and RN attempting to wean propofol. Precedex paused at this time for bradycardia approaching upper 40's. Pt is -4 RASS and RN weaning sedation carefully. Pt appears to go from extremes of very sedated to very agitated. Foley intact with adequate urine output. OG tube to int. Suction. Central line intact. Mother updated at bedside. Mother will come 9/3 1000 to be present for WUA. Will continue to monitor.

## 2021-09-16 NOTE — Consult Note (Signed)
Client is on Precedex and intubated, unable to assess.  Psych will continue to follow.  Nanine Means, PMHNP

## 2021-09-16 NOTE — Consult Note (Signed)
PHARMACY CONSULT NOTE  Pharmacy Consult for Electrolyte Monitoring and Replacement   Recent Labs: Potassium (mmol/L)  Date Value  09/16/2021 3.2 (L)   Magnesium (mg/dL)  Date Value  46/96/2952 2.1   Calcium (mg/dL)  Date Value  84/13/2440 6.8 (L)   Albumin (g/dL)  Date Value  11/11/2534 2.6 (L)   Phosphorus (mg/dL)  Date Value  64/40/3474 2.1 (L)   Sodium (mmol/L)  Date Value  09/16/2021 141   Corrected Ca: 7.9 mg/dL  Assessment: 36 y.o female with significant PMH of anxiety and depression, polysubstance abuse disorder, pyelonephritis, hypothyroidism, ADD, asthma, migraine headaches who presented to the ED on 8/31 with chief complaints of extreme agitation and possible drug withdrawals. On NS @ 125 ml/hr. Pt is intubated.   Goal of Therapy:  WNL  Plan:  KCl 40 mEq per tube x 1 per NP 15 mmol potassium phosphate IV x 1 per NP (contains 22 mEq IV potassium) Recheck electrolytes in am  Lowella Bandy ,PharmD Clinical Pharmacist 09/16/2021 7:08 AM

## 2021-09-16 NOTE — Procedures (Signed)
Central Venous Catheter Insertion Procedure Note  Cassandra [name]  [id number]  [birth year]  Date:09/16/21  Time:5:03 PM   Provider Performing:Chayse Zatarain A Barrett Goldie   Procedure: Insertion of Non-tunneled Central Venous Catheter(36556) with US guidance (25053)   Indication(s) Medication administration and Difficult access  Consent Unable to obtain consent due to emergent nature of procedure.  Anesthesia Topical only with 1% lidocaine   Timeout Verified patient identification, verified procedure, site/side was marked, verified correct patient position, special equipment/implants available, medications/allergies/relevant history reviewed, required imaging and test results available.  Sterile Technique Maximal sterile technique including full sterile barrier drape, hand hygiene, sterile gown, sterile gloves, mask, hair covering, sterile ultrasound probe cover (if used).  Procedure Description Area of catheter insertion was cleaned with chlorhexidine and draped in sterile fashion.  With real-time ultrasound guidance a central venous catheter was placed into the right internal jugular vein. Nonpulsatile blood flow and easy flushing noted in all ports.  The catheter was sutured in place and sterile dressing applied.  Complications/Tolerance None; patient tolerated the procedure well. Chest X-ray is ordered to verify placement for internal jugular or subclavian cannulation.   Chest x-ray is not ordered for femoral cannulation.  EBL Minimal  Specimen(s) None   Webb Silversmith, DNP, CCRN, FNP-C, AGACNP-BC Acute Care & Family Nurse Practitioner  [facility]  See Amion for personal pager PCCM on call pager [phone] until 7 am

## 2021-09-17 DIAGNOSIS — R451 Restlessness and agitation: Secondary | ICD-10-CM | POA: Diagnosis not present

## 2021-09-17 DIAGNOSIS — F1510 Other stimulant abuse, uncomplicated: Secondary | ICD-10-CM | POA: Diagnosis not present

## 2021-09-17 LAB — HEMOGLOBIN AND HEMATOCRIT, BLOOD
HCT: 30.3 % — ABNORMAL LOW (ref 36.0–46.0)
Hemoglobin: 9.5 g/dL — ABNORMAL LOW (ref 12.0–15.0)

## 2021-09-17 LAB — BASIC METABOLIC PANEL
Anion gap: 8 (ref 5–15)
BUN: 6 mg/dL (ref 6–20)
CO2: 16 mmol/L — ABNORMAL LOW (ref 22–32)
Calcium: 7 mg/dL — ABNORMAL LOW (ref 8.9–10.3)
Chloride: 111 mmol/L (ref 98–111)
Creatinine, Ser: 0.41 mg/dL — ABNORMAL LOW (ref 0.44–1.00)
GFR, Estimated: >60 mL/min (ref >60)
Glucose, Bld: 72 mg/dL (ref 70–99)
Potassium: 3.4 mmol/L — ABNORMAL LOW (ref 3.5–5.1)
Sodium: 135 mmol/L (ref 135–145)

## 2021-09-17 LAB — CBC
HCT: 27.4 % — ABNORMAL LOW (ref 36.0–46.0)
Hemoglobin: 8.3 g/dL — ABNORMAL LOW (ref 12.0–15.0)
MCH: 23.4 pg — ABNORMAL LOW (ref 26.0–34.0)
MCHC: 30.3 g/dL (ref 30.0–36.0)
MCV: 77.2 fL — ABNORMAL LOW (ref 80.0–100.0)
Platelets: 426 10*3/uL — ABNORMAL HIGH (ref 150–400)
RBC: 3.55 MIL/uL — ABNORMAL LOW (ref 3.87–5.11)
RDW: 17.1 % — ABNORMAL HIGH (ref 11.5–15.5)
WBC: 8.1 10*3/uL (ref 4.0–10.5)
nRBC: 0 % (ref 0.0–0.2)

## 2021-09-17 LAB — GLUCOSE, CAPILLARY
Glucose-Capillary: 121 mg/dL — ABNORMAL HIGH (ref 70–99)
Glucose-Capillary: 125 mg/dL — ABNORMAL HIGH (ref 70–99)
Glucose-Capillary: 139 mg/dL — ABNORMAL HIGH (ref 70–99)
Glucose-Capillary: 142 mg/dL — ABNORMAL HIGH (ref 70–99)
Glucose-Capillary: 147 mg/dL — ABNORMAL HIGH (ref 70–99)
Glucose-Capillary: 151 mg/dL — ABNORMAL HIGH (ref 70–99)
Glucose-Capillary: 63 mg/dL — ABNORMAL LOW (ref 70–99)
Glucose-Capillary: 64 mg/dL — ABNORMAL LOW (ref 70–99)
Glucose-Capillary: 67 mg/dL — ABNORMAL LOW (ref 70–99)
Glucose-Capillary: 67 mg/dL — ABNORMAL LOW (ref 70–99)

## 2021-09-17 LAB — CK: Total CK: 3495 U/L — ABNORMAL HIGH (ref 38–234)

## 2021-09-17 LAB — MAGNESIUM: Magnesium: 1.7 mg/dL (ref 1.7–2.4)

## 2021-09-17 LAB — PHOSPHORUS: Phosphorus: 2.6 mg/dL (ref 2.5–4.6)

## 2021-09-17 MED ORDER — GABAPENTIN 100 MG PO CAPS
200.0000 mg | ORAL_CAPSULE | Freq: Two times a day (BID) | ORAL | Status: DC
  Administered 2021-09-18: 200 mg via ORAL
  Filled 2021-09-17: qty 2

## 2021-09-17 MED ORDER — PHENOBARBITAL SODIUM 65 MG/ML IJ SOLN
32.4000 mg | Freq: Once | INTRAMUSCULAR | Status: AC
  Administered 2021-09-17: 32.4 mg via INTRAVENOUS
  Filled 2021-09-17: qty 1

## 2021-09-17 MED ORDER — PANTOPRAZOLE SODIUM 40 MG IV SOLR
40.0000 mg | Freq: Two times a day (BID) | INTRAVENOUS | Status: DC
  Administered 2021-09-17 – 2021-09-21 (×9): 40 mg via INTRAVENOUS
  Filled 2021-09-17 (×9): qty 10

## 2021-09-17 MED ORDER — MIDAZOLAM BOLUS VIA INFUSION
2.0000 mg | Freq: Once | INTRAVENOUS | Status: AC
  Administered 2021-09-17: 2 mg via INTRAVENOUS
  Filled 2021-09-17: qty 2

## 2021-09-17 MED ORDER — MAGNESIUM SULFATE 2 GM/50ML IV SOLN
2.0000 g | Freq: Once | INTRAVENOUS | Status: AC
  Administered 2021-09-17: 2 g via INTRAVENOUS
  Filled 2021-09-17: qty 50

## 2021-09-17 MED ORDER — PHENOBARBITAL SODIUM 65 MG/ML IJ SOLN
64.8000 mg | Freq: Three times a day (TID) | INTRAMUSCULAR | Status: AC
  Administered 2021-09-17 – 2021-09-18 (×3): 64.8 mg via INTRAVENOUS
  Filled 2021-09-17 (×3): qty 1

## 2021-09-17 MED ORDER — DEXTROSE 50 % IV SOLN
INTRAVENOUS | Status: AC
  Filled 2021-09-17: qty 50

## 2021-09-17 MED ORDER — DEXTROSE 50 % IV SOLN
12.5000 g | INTRAVENOUS | Status: AC
  Administered 2021-09-17: 12.5 g via INTRAVENOUS

## 2021-09-17 MED ORDER — DEXTROSE IN LACTATED RINGERS 5 % IV SOLN: INTRAVENOUS | Status: DC

## 2021-09-17 MED ORDER — LORAZEPAM 2 MG/ML IJ SOLN
2.0000 mg | Freq: Once | INTRAMUSCULAR | Status: AC
Start: 2021-09-17 — End: 2021-09-17
  Administered 2021-09-17: 2 mg via INTRAVENOUS
  Filled 2021-09-17: qty 1

## 2021-09-17 MED ORDER — DEXMEDETOMIDINE HCL IN NACL 400 MCG/100ML IV SOLN
0.4000 ug/kg/h | INTRAVENOUS | Status: DC
  Administered 2021-09-17: 1.5 ug/kg/h via INTRAVENOUS
  Administered 2021-09-18: 1.2 ug/kg/h via INTRAVENOUS
  Administered 2021-09-18: 1.4 ug/kg/h via INTRAVENOUS
  Administered 2021-09-18 – 2021-09-19 (×2): 1.5 ug/kg/h via INTRAVENOUS
  Filled 2021-09-17 (×5): qty 100

## 2021-09-17 MED ORDER — PHENOBARBITAL SODIUM 65 MG/ML IJ SOLN
32.4000 mg | Freq: Three times a day (TID) | INTRAMUSCULAR | Status: DC
  Administered 2021-09-18 – 2021-09-19 (×3): 32.4 mg via INTRAVENOUS
  Filled 2021-09-17 (×3): qty 1

## 2021-09-17 MED ORDER — TRAZODONE HCL 50 MG PO TABS
100.0000 mg | ORAL_TABLET | Freq: Every day | ORAL | Status: DC
  Administered 2021-09-18 – 2021-09-20 (×3): 100 mg via ORAL
  Filled 2021-09-17 (×3): qty 1

## 2021-09-17 MED ORDER — POTASSIUM CHLORIDE 20 MEQ PO PACK
40.0000 meq | PACK | Freq: Once | ORAL | Status: AC
Start: 2021-09-17 — End: 2021-09-17
  Administered 2021-09-17: 40 meq
  Filled 2021-09-17: qty 2

## 2021-09-17 NOTE — Consult Note (Signed)
PHARMACY CONSULT NOTE  Pharmacy Consult for Electrolyte Monitoring and Replacement   Recent Labs: Potassium (mmol/L)  Date Value  09/17/2021 3.4 (L)   Magnesium (mg/dL)  Date Value  83/66/2947 1.7   Calcium (mg/dL)  Date Value  65/46/5035 7.0 (L)   Albumin (g/dL)  Date Value  46/56/8127 2.6 (L)   Phosphorus (mg/dL)  Date Value  51/70/0174 2.6   Sodium (mmol/L)  Date Value  09/17/2021 135   Corrected Ca: 7.9 mg/dL  Assessment: 36 y.o female with significant PMH of anxiety and depression, polysubstance abuse disorder, pyelonephritis, hypothyroidism, ADD, asthma, migraine headaches who presented to the ED on 8/31 with chief complaints of extreme agitation and possible drug withdrawals. On NS @ 125 ml/hr. Pt is intubated.   Goal of Therapy:  WNL  Plan:  2 grams IV magnesium sulfate x 1 per NP 40 mEq KCl per tube x 1 per NP Recheck electrolytes in am  Lowella Bandy ,PharmD Clinical Pharmacist 09/17/2021 6:52 AM

## 2021-09-17 NOTE — Progress Notes (Signed)
Pt. Extubated to room air,sat 98 

## 2021-09-17 NOTE — Consult Note (Signed)
PHARMACY CONSULT NOTE  Pharmacy Consult for Electrolyte Monitoring and Replacement   Recent Labs: Potassium (mmol/L)  Date Value  09/17/2021 3.4 (L)   Magnesium (mg/dL)  Date Value  81/18/8677 1.7   Calcium (mg/dL)  Date Value  37/36/6815 7.0 (L)   Albumin (g/dL)  Date Value  94/70/7615 2.6 (L)   Phosphorus (mg/dL)  Date Value  18/34/3735 2.6   Sodium (mmol/L)  Date Value  09/17/2021 135   Corrected Ca: 7.9 mg/dL  Assessment: 36 y.o female with significant PMH of anxiety and depression, polysubstance abuse disorder, pyelonephritis, hypothyroidism, ADD, asthma, migraine headaches who presented to the ED on 8/31 with chief complaints of extreme agitation and possible drug withdrawals. On NS @ 125 ml/hr. Pt is intubated.   Goal of Therapy:  WNL  Plan:  K 3.4 - repleted with KCl 40 mEq per tube x1 on 9/03 per NP. Phos 2.1>2.6 in response to 15 mmol potassium phosphate IV x 1 per NP on 9/02 (contains 22 mEq IV potassium). WNL; No further repletion at this time. Mg 2.1>1.7: WNL, CTM for repletion PRN. Recheck electrolytes in am  Martyn Malay ,PharmD Clinical Pharmacist 09/17/2021 12:54 PM

## 2021-09-17 NOTE — Progress Notes (Signed)
NAME:  Cassandra [name], MRN:  [id number], DOB:  [birth year], LOS: 2 ADMISSION DATE:  09/14/2021, CONSULTATION DATE:  09/15/2021 REFERRING MD: Chiquita Loth, MD, CHIEF COMPLAINT:  Drug Induced Psychosis   HPI  36 y.o female with significant PMH of anxiety and depression, polysubstance abuse disorder, pyelonephritis, hypothyroidism, ADD, asthma, migraine headaches who presented to the ED on 8/31 with chief complaints of extreme agitation and possible drug withdrawals.  Per ED notes, patient arrived from home via [facility] screaming and yelling incoherently.  Patient reported to ED staff that she takes Buprenorphine and went to pick it up from the pharmacy but claimed she was dispensed the wrong medication.  Patient reported that she thinks she is going through withdrawals.  She arrived with bottles of Lexapro and amphetamine salts which apparently is not prescribed to her.  Patient admitted to taking methamphetamine and fentanyl the previous day.  ED Course: In the emergency department, vital signs showed blood pressure of 120/85 mm Hg, tachycardia of 106 beats per minute, a respiratory rate of 22 breaths per minute, and a temperature of 98.71F (36.9C), sats 100% on RA.  Patient was extremely agitated, sitting on the bathroom floor and experiencing internal and external stimuli.  She apparently was also exhibiting some delusional thinking but still able to answer questions appropriately.  Due to  acute presentation with potential threat to life or bodily function patient was placed under involuntary commitment.  She was evaluated by psych for psychotic and paranoid behaviors.  While still holding down in the ED pending inpatient psych placement, patient went into acute paranoia screaming and yelling while seated on the floor. Patient required several doses of IV lorazepam, 4 mg of Versed, morphine 4 mg, Haldol 5 mg, 25 mg Benadryl IM, droperidol 5 mg and Geodon with no improvement.  Patient continued to thrash about  in bed extremely agitated and combative.  She was placed on four-point restraint however this was not adequate to hold her down therefore decision was made to intubate and sedate her for safety reasons.  PCCM consulted  Pertinent Labs/Diagnostics Findings: Chemistry:Na+/ K+:  Glucose: BUN/Cr.:Calcium:  AST/ALT: CBC: WBC: 8.7Hgb/Hct:9.5/31.1  Other Lab findings ABG result:  pO2 192; pCO2 33; pH 7.47;  HCO3 24, %O2 Sat 99.8.  UDS:+ Amphetamines, cocaine metabolites, and cannabinoid Imaging:  CXR> no acute cardiopulmonary process CTH> no acute intracranial abnormality  Past Medical History  Anxiety and Depression, polysubstance abuse disorder, pyelonephritis, hypothyroidism, ADD, asthma, migraine headaches  Significant Hospital Events   9/1:Admitted to ICU with drug induced psychosis and respiratory failure requiring mechanical ventilation. 9/2: WUA performed pt remains severely agitated/delirious with precedex gtt will start phenobarbital taper and resume versed/fentanyl/propofol gtts  9/3: Pt remains mechanically intubated on minimal ventilator settings.  Sedated with precedex/fentanyl/propofol/versed gtts remains intermittently agitated/delirious.  Will attempt WUA once pts mother arrives at bedside   Consults:  PCCM  Procedures:  9/1: Intubation 9/1: Right IJ central line  Significant Diagnostic Tests:  9/1: Chest Xray> no acute cardiopulmonary process 9/1: Abdominal xray>Gas-filled but nondilated small and large bowel loops may indicate ileus. 8/31: Noncontrast CT head> no acute intracranial abnormality  Micro Data:  9/1: Blood culture x2>negative  9/1: MRSA PCR>>positive  9/1: Strep pneumo urinary antigen>negative   Antimicrobials:  Unaysn 9/1>  OBJECTIVE  Blood pressure 113/82, pulse 76, temperature 99.4 F (37.4 C), temperature source Oral, resp. rate 18, height 5\' 2"  (1.575 m), weight 36.1 kg, SpO2 100 %, not currently breastfeeding.    Vent Mode: PRVC  FiO2 (%):  [28 %]  28 % Set Rate:  [18 bmp] 18 bmp Vt Set:  [450 mL] 450 mL PEEP:  [5 cmH20] 5 cmH20 Plateau Pressure:  [13 cmH20-14 cmH20] 13 cmH20   Intake/Output Summary (Last 24 hours) at 09/17/2021 0725 Last data filed at 09/17/2021 9147 Gross per 24 hour  Intake 4577.14 ml  Output 5975 ml  Net -1397.86 ml   Filed Weights   09/14/21 2139 09/16/21 0425 09/17/21 0428  Weight: 48.5 kg 41.9 kg 36.1 kg     Physical Examination  GENERAL: Acutely-ill appearing female, NAD mechanically intubated  HEENT: Head atraumatic, normocephalic. Oropharynx and nasopharynx clear. Supple, no JVD  LUNGS: Rhonchi throughout, even, non labored  CARDIOVASCULAR: NSR, rrr, no r/g, 2+ radial/1+ distal pulses, 1+ bilateral lower extremity edema  ABDOMEN: +BS x4, soft, non tender, non distended EXTREMITIES: Normal bulk and tone, moves all extremities  NEUROLOGIC: Sedated, not following commands, withdraws from stimulation, moves all extremities, agitated/delirious  SKIN: No obvious rash, lesion, or ulcer.   Labs/imaging that I havepersonally reviewed  (right click and "Reselect all SmartList Selections" daily)     Labs   CBC: Recent Labs  Lab 09/14/21 2150 09/15/21 0558 09/16/21 0419 09/16/21 0940 09/16/21 1600 09/16/21 2148 09/17/21 0307  WBC 6.3 8.7 8.3  --   --   --  8.1  HGB 12.5 9.5* 7.8* 7.5* 7.2* 7.9* 8.3*  HCT 41.9 31.1* 25.4* 24.7* 23.4* 25.6* 27.4*  MCV 78.3* 76.6* 76.7*  --   --   --  77.2*  PLT 566* 294 397  --   --   --  426*    Basic Metabolic Panel: Recent Labs  Lab 09/14/21 2150 09/15/21 0457 09/16/21 0419 09/16/21 1131 09/16/21 1257 09/17/21 0307  NA 137 137 141  --   --  135  K 4.4 3.3* 3.2* 5.9* 3.9 3.4*  CL 104 109 116*  --   --  111  CO2 25 22 22   --   --  16*  GLUCOSE 97 110* 84  --   --  72  BUN 18 11 8   --   --  6  CREATININE 0.67 0.41* 0.43*  --   --  0.41*  CALCIUM 8.9 7.5* 6.8*  --   --  7.0*  MG  --  1.8 2.1  --   --  1.7  PHOS  --  2.0* 2.1* 8.0* 2.7 2.6    GFR: Estimated Creatinine Clearance: 55.4 mL/min (A) (by C-G formula based on SCr of 0.41 mg/dL (L)). Recent Labs  Lab 09/14/21 2150 09/15/21 0457 09/15/21 0558 09/16/21 0419 09/17/21 0307  WBC 6.3  --  8.7 8.3 8.1  LATICACIDVEN  --  0.6  --   --   --     Liver Function Tests: Recent Labs  Lab 09/14/21 2150 09/15/21 0457 09/16/21 0419  AST 36 46* 29  ALT 17 21 15   ALKPHOS 70 53 49  BILITOT 0.9 0.7 0.6  PROT 8.4* 6.6 5.0*  ALBUMIN 4.3 3.5 2.6*   No results for input(s): "LIPASE", "AMYLASE" in the last 168 hours. No results for input(s): "AMMONIA" in the last 168 hours.  ABG    Component Value Date/Time   PHART 7.47 (H) 09/15/2021 0457   PCO2ART 33 09/15/2021 0457   PO2ART 192 (H) 09/15/2021 0457   HCO3 24.0 09/15/2021 0457   O2SAT 99.8 09/15/2021 0457     Coagulation Profile: Recent Labs  Lab 09/15/21 0457  INR 1.1  Cardiac Enzymes: Recent Labs  Lab 09/14/21 2150 09/15/21 0457 09/16/21 0419  CKTOTAL 192 1,903* 672*    HbA1C: No results found for: "HGBA1C"  CBG: Recent Labs  Lab 09/16/21 2010 09/16/21 2346 09/17/21 0339 09/17/21 0500 09/17/21 0521  GLUCAP 128* 75 67* 64* 121*    Review of Systems:   Unable to assess patient is currently intubated and sedated  Past Medical History  She,  has a past medical history of ADHD (attention deficit hyperactivity disorder), Anxiety, Asthma, and Kidney stone.   Surgical History    Past Surgical History:  Procedure Laterality Date   ELBOW FRACTURE SURGERY     EYE MUSCLE SURGERY Bilateral    OVARIAN CYST REMOVAL Right    TONSILLECTOMY       Social History   reports that she has been smoking cigarettes. She has been smoking an average of .5 packs per day. She has never used smokeless tobacco. She reports that she does not currently use alcohol. She reports that she does not currently use drugs.   Family History   Her family history is not on file.   Allergies Allergies  Allergen  Reactions   Metronidazole Nausea And Vomiting   Prednisone Other (See Comments)    irritable/ angry and very lethargic when finishes     Home Medications  Prior to Admission medications   Medication Sig Start Date End Date Taking? Authorizing Provider  ADDERALL XR 30 MG 24 hr capsule Take 30 mg by mouth 2 (two) times daily. 09/01/20   [provider]  buprenorphine (SUBUTEX) 8 MG SUBL SL tablet Place 8 mg under the tongue 2 (two) times daily. 09/09/20   [provider]  buPROPion (WELLBUTRIN SR) 150 MG 12 hr tablet Take 150 mg by mouth every morning. 08/21/20   [provider]  escitalopram (LEXAPRO) 20 MG tablet Take 20 mg by mouth daily. 09/01/20   [provider]  etonogestrel-ethinyl estradiol (NUVARING) 0.12-0.015 MG/24HR vaginal ring Insert vaginally and leave in place for 3 consecutive weeks, then remove for 1 week. 10/31/20   Vena Austria, MD  ibuprofen (ADVIL) 600 MG tablet Take 1 tablet (600 mg total) by mouth every 6 (six) hours. 09/19/20   Mirna Mires, CNM  methylergonovine (METHERGINE) 0.2 MG tablet Take 1 tablet (0.2 mg total) by mouth 3 (three) times daily. 10/01/20   Nadara Mustard, MD  nitrofurantoin, macrocrystal-monohydrate, (MACROBID) 100 MG capsule Take 1 capsule (100 mg total) by mouth every 12 (twelve) hours. Patient not taking: Reported on 10/31/2020 10/01/20   Nadara Mustard, MD  promethazine (PHENERGAN) 25 MG suppository Place rectally. 10/27/20   [provider]  traZODone (DESYREL) 100 MG tablet Take 200 mg by mouth at bedtime as needed. 09/13/20   [provider]    Scheduled Meds:  Chlorhexidine Gluconate Cloth  6 each Topical Daily   docusate  100 mg Per Tube BID   mouth rinse  15 mL Mouth Rinse Q2H   [START ON 09/19/2021] pantoprazole  40 mg Intravenous Q12H   phenobarbital  64.8 mg Per Tube Q8H   Followed by   [START ON 09/18/2021] phenobarbital  32.4 mg Per Tube Q8H   polyethylene glycol  17 g Per  Tube Daily   Continuous Infusions:  ampicillin-sulbactam (UNASYN) IV Stopped (09/17/21 0600)   dexmedetomidine (PRECEDEX) IV infusion Stopped (09/16/21 1732)   dextrose 5% lactated ringers 75 mL/hr at 09/17/21 0648   fentaNYL infusion INTRAVENOUS 200 mcg/hr (09/17/21 0648)   midazolam  4 mg/hr (09/17/21 4827)   pantoprazole 8 mg/hr (09/17/21 0786)   propofol (DIPRIVAN) infusion 70 mcg/kg/min (09/17/21 0648)   PRN Meds:.docusate sodium, fentaNYL, LORazepam, midazolam, mouth rinse, polyethylene glycol, vecuronium   Active Hospital Problem list     Assessment & Plan:    Acute Hypoxic Respiratory Failure in the setting of Acute Drug Intoxication and possible Aspiration Event - Wean PEEP and FiO2 for sats greater than 90% - Plateau pressures less than 30 cm H20 - Con't LTVV, Vt decreased due to high Pplat and rate increased.  - VAP bundle in place - Intermittent chest x-ray & ABG - F/u cultures, trend PCT - Continue Aspiration Pna coverage: Unasyn - Wean sedation/analgesia for RASS goal 0 to -1 - PAD protocol: Continue to attempt to wean propofol/versed/ fentanyl/precedex gtts.  Continue phenobarbital taper   Toxic-Metabolic Encephalopathy secondary to Drug Intoxication Drug Induced Psychosis  Hx: ADD, polysubstance abuse, opiate dependence on Subutex, Wellbutrin  CT Head negative for acute intracranial abnormality - Seizure precautions  - Continue supportive care  - Once extubated will need Psychiatry consult and sitter at bedside pt currently Involuntarily Committed  - Polysubstance abuse cessation counseling once extubated   Acute rhabdomyolysis~improving  Hypokalemia Hypomagnesia  - Trend BMP  - Replace electrolytes as indicated  - Monitor UOP - Avoid nephrotoxic medications   Acute blood loss anemia: Coffee ground emesis per OGT~improbing  - H&H q6hrs  - Gastric occult blood results pending  - Monitor for s/sx of bleeding and transfuse for hgb <7 - Avoid chemical  VTE px; subq lovenox discontinued  - Will transition off protonix gtt and start intermittent protonix q12hrs   Anxiety and depression - Hold Lexapro, Wellbutrin and trazodone for now  Best practice:  Diet:  NPO Pain/Anxiety/Delirium protocol (if indicated): Yes (RASS goal 0) VAP protocol (if indicated): Yes DVT prophylaxis: Contraindicated GI prophylaxis: PPI Glucose control: N/A Central venous access:  Yes, and it is still needed Arterial line:  N/A Foley:  Yes, and it is still needed Mobility:  bed rest  PT consulted: NO Last date of multidisciplinary goals of care discussion [9/1] Code Status:  full code Disposition: ICU   Critical care time: 38 minutes     Zada Girt, AGNP  Pulmonary/Critical Care Pager [phone] (please enter 7 digits) PCCM Consult Pager [phone] (please enter 7 digits)

## 2021-09-17 NOTE — Progress Notes (Signed)
Pt extubated per order, to RA, sat 98, pt very tearful and upset, mother at bedside trying to help calm pt down, md notified pt stating she wants to die and is hurting all over, gave ordered phenobarbital, pt settling down after dose, still with mother at bedside, and sitter, will continue to monitor.

## 2021-09-18 DIAGNOSIS — T50905A Adverse effect of unspecified drugs, medicaments and biological substances, initial encounter: Secondary | ICD-10-CM | POA: Diagnosis not present

## 2021-09-18 DIAGNOSIS — R410 Disorientation, unspecified: Secondary | ICD-10-CM

## 2021-09-18 LAB — BASIC METABOLIC PANEL
Anion gap: 5 (ref 5–15)
BUN: <5 mg/dL — ABNORMAL LOW (ref 6–20)
CO2: 22 mmol/L (ref 22–32)
Calcium: 8 mg/dL — ABNORMAL LOW (ref 8.9–10.3)
Chloride: 107 mmol/L (ref 98–111)
Creatinine, Ser: 0.46 mg/dL (ref 0.44–1.00)
GFR, Estimated: >60 mL/min (ref >60)
Glucose, Bld: 133 mg/dL — ABNORMAL HIGH (ref 70–99)
Potassium: 3.6 mmol/L (ref 3.5–5.1)
Sodium: 134 mmol/L — ABNORMAL LOW (ref 135–145)

## 2021-09-18 LAB — CBC
HCT: 29.3 % — ABNORMAL LOW (ref 36.0–46.0)
Hemoglobin: 9 g/dL — ABNORMAL LOW (ref 12.0–15.0)
MCH: 23.2 pg — ABNORMAL LOW (ref 26.0–34.0)
MCHC: 30.7 g/dL (ref 30.0–36.0)
MCV: 75.5 fL — ABNORMAL LOW (ref 80.0–100.0)
Platelets: 411 10*3/uL — ABNORMAL HIGH (ref 150–400)
RBC: 3.88 MIL/uL (ref 3.87–5.11)
RDW: 16.4 % — ABNORMAL HIGH (ref 11.5–15.5)
WBC: 7.2 10*3/uL (ref 4.0–10.5)
nRBC: 0 % (ref 0.0–0.2)

## 2021-09-18 LAB — GLUCOSE, CAPILLARY
Glucose-Capillary: 120 mg/dL — ABNORMAL HIGH (ref 70–99)
Glucose-Capillary: 120 mg/dL — ABNORMAL HIGH (ref 70–99)
Glucose-Capillary: 139 mg/dL — ABNORMAL HIGH (ref 70–99)
Glucose-Capillary: 144 mg/dL — ABNORMAL HIGH (ref 70–99)
Glucose-Capillary: 144 mg/dL — ABNORMAL HIGH (ref 70–99)

## 2021-09-18 LAB — CK: Total CK: 2913 U/L — ABNORMAL HIGH (ref 38–234)

## 2021-09-18 LAB — MAGNESIUM: Magnesium: 1.8 mg/dL (ref 1.7–2.4)

## 2021-09-18 LAB — PHOSPHORUS: Phosphorus: 2.9 mg/dL (ref 2.5–4.6)

## 2021-09-18 MED ORDER — ADULT MULTIVITAMIN W/MINERALS CH
1.0000 | ORAL_TABLET | Freq: Every day | ORAL | Status: DC
  Administered 2021-09-19 – 2021-09-20 (×2): 1 via ORAL
  Filled 2021-09-18 (×3): qty 1

## 2021-09-18 MED ORDER — GABAPENTIN 300 MG PO CAPS
400.0000 mg | ORAL_CAPSULE | Freq: Two times a day (BID) | ORAL | Status: DC
  Administered 2021-09-18 – 2021-09-21 (×6): 400 mg via ORAL
  Filled 2021-09-18 (×6): qty 1

## 2021-09-18 MED ORDER — LORAZEPAM 2 MG/ML IJ SOLN
2.0000 mg | Freq: Once | INTRAMUSCULAR | Status: AC
  Administered 2021-09-18: 2 mg via INTRAVENOUS

## 2021-09-18 MED ORDER — LORAZEPAM 2 MG/ML IJ SOLN
INTRAMUSCULAR | Status: AC
  Filled 2021-09-18: qty 1

## 2021-09-18 MED ORDER — ENSURE ENLIVE PO LIQD
237.0000 mL | Freq: Three times a day (TID) | ORAL | Status: DC
  Administered 2021-09-19 – 2021-09-21 (×6): 237 mL via ORAL

## 2021-09-18 MED ORDER — BUPRENORPHINE HCL 2 MG SL SUBL
8.0000 mg | SUBLINGUAL_TABLET | Freq: Every day | SUBLINGUAL | Status: DC
  Administered 2021-09-18 – 2021-09-21 (×4): 8 mg via SUBLINGUAL
  Filled 2021-09-18 (×4): qty 4

## 2021-09-18 MED ORDER — BUPRENORPHINE HCL 2 MG SL SUBL
4.0000 mg | SUBLINGUAL_TABLET | Freq: Every evening | SUBLINGUAL | Status: DC
  Administered 2021-09-18 – 2021-09-21 (×4): 4 mg via SUBLINGUAL
  Filled 2021-09-18 (×4): qty 2

## 2021-09-18 MED ORDER — LORAZEPAM 2 MG/ML IJ SOLN
2.0000 mg | INTRAMUSCULAR | Status: DC | PRN
  Administered 2021-09-19 – 2021-09-21 (×6): 2 mg via INTRAVENOUS
  Filled 2021-09-18 (×6): qty 1

## 2021-09-18 NOTE — Progress Notes (Signed)
NAME:  Cassandra [name], MRN:  [id number], DOB:  Jun 30, 1985, LOS: 3 ADMISSION DATE:  09/14/2021, CONSULTATION DATE:  09/15/2021 REFERRING MD: Chiquita Loth, MD, CHIEF COMPLAINT:  Drug Induced Psychosis   HPI  36 y.o female with significant PMH of anxiety and depression, polysubstance abuse disorder, pyelonephritis, hypothyroidism, ADD, asthma, migraine headaches who presented to the ED on 8/31 with chief complaints of extreme agitation and possible drug withdrawals.  Per ED notes, patient arrived from home via [facility] screaming and yelling incoherently.  Patient reported to ED staff that she takes Buprenorphine and went to pick it up from the pharmacy but claimed she was dispensed the wrong medication.  Patient reported that she thinks she is going through withdrawals.  She arrived with bottles of Lexapro and amphetamine salts which apparently is not prescribed to her.  Patient admitted to taking methamphetamine and fentanyl the previous day.  ED Course: In the emergency department, vital signs showed blood pressure of 120/85 mm Hg, tachycardia of 106 beats per minute, a respiratory rate of 22 breaths per minute, and a temperature of 98.64F (36.9C), sats 100% on RA.  Patient was extremely agitated, sitting on the bathroom floor and experiencing internal and external stimuli.  She apparently was also exhibiting some delusional thinking but still able to answer questions appropriately.  Due to  acute presentation with potential threat to life or bodily function patient was placed under involuntary commitment.  She was evaluated by psych for psychotic and paranoid behaviors.  While still holding down in the ED pending inpatient psych placement, patient went into acute paranoia screaming and yelling while seated on the floor. Patient required several doses of IV lorazepam, 4 mg of Versed, morphine 4 mg, Haldol 5 mg, 25 mg Benadryl IM, droperidol 5 mg and Geodon with no improvement.  Patient continued to thrash about  in bed extremely agitated and combative.  She was placed on four-point restraint however this was not adequate to hold her down therefore decision was made to intubate and sedate her for safety reasons.  PCCM consulted  Pertinent Labs/Diagnostics Findings: Chemistry:Na+/ K+:  Glucose: BUN/Cr.:Calcium:  AST/ALT: CBC: WBC: 8.7Hgb/Hct:9.5/31.1  Other Lab findings ABG result:  pO2 192; pCO2 33; pH 7.47;  HCO3 24, %O2 Sat 99.8.  UDS:+ Amphetamines, cocaine metabolites, and cannabinoid Imaging:  CXR> no acute cardiopulmonary process CTH> no acute intracranial abnormality  Past Medical History  Anxiety and Depression, polysubstance abuse disorder, pyelonephritis, hypothyroidism, ADD, asthma, migraine headaches  Significant Hospital Events   9/1:Admitted to ICU with drug induced psychosis and respiratory failure requiring mechanical ventilation. 9/2: WUA performed pt remains severely agitated/delirious with precedex gtt will start phenobarbital taper and resume versed/fentanyl/propofol gtts  9/3: Pt remains mechanically intubated on minimal ventilator settings.  Sedated with precedex/fentanyl/propofol/versed gtts remains intermittently agitated/delirious.  Will attempt WUA once pts mother arrives at bedside ~ EXTUBATED 9/4: Calm on Precedex, wean as tolerated.  Respiratory status stable s/p extubation yesterday.  Psych consult pending  Consults:  PCCM Psychiatry  Procedures:  9/1: Intubation 9/1: Right IJ central line  Significant Diagnostic Tests:  9/1: Chest Xray> no acute cardiopulmonary process 9/1: Abdominal xray>Gas-filled but nondilated small and large bowel loops may indicate ileus. 8/31: Noncontrast CT head> no acute intracranial abnormality  Micro Data:  9/1: Blood culture x2>NGTD 9/1: MRSA PCR>>positive  9/1: Strep pneumo urinary antigen>negative   Antimicrobials:  Unaysn 9/1>  OBJECTIVE  Blood pressure 100/80, pulse (!) 53, temperature 98.9 F (37.2 C), temperature source  Oral, resp. rate  13, height 5\' 2"  (1.575 m), weight 36.1 kg, SpO2 100 %, not currently breastfeeding.        Intake/Output Summary (Last 24 hours) at 09/18/2021 0810 Last data filed at 09/18/2021 0107 Gross per 24 hour  Intake 1102.45 ml  Output 3875 ml  Net -2772.55 ml    Filed Weights   09/14/21 2139 09/16/21 0425 09/17/21 0428  Weight: 48.5 kg 41.9 kg 36.1 kg     Physical Examination  GENERAL: Acutely-ill appearing female, laying in bed, calm on precedex, NAD  HEENT: Head atraumatic, normocephalic. Oropharynx and nasopharynx clear. Supple, no JVD  LUNGS: clear throughout, even, non labored  CARDIOVASCULAR: Bradycardia, rrr, no r/g, 2+ radial/1+ distal pulses, 1+ bilateral lower extremity edema  ABDOMEN: +BS x4, soft, non tender, non distended EXTREMITIES: Normal bulk and tone, moves all extremities, no focal deficits NEUROLOGIC: Sleeping on precedex, arouses easily to voice, move all extremities to commands, no focal deficits, oriented x3 SKIN: No obvious rash, lesion, or ulcer.   Labs/imaging that I havepersonally reviewed  (right click and "Reselect all SmartList Selections" daily)     Labs   CBC: Recent Labs  Lab 09/14/21 2150 09/15/21 0558 09/16/21 0419 09/16/21 0940 09/16/21 1600 09/16/21 2148 09/17/21 0307 09/17/21 1035 09/18/21 0248  WBC 6.3 8.7 8.3  --   --   --  8.1  --  7.2  HGB 12.5 9.5* 7.8*   < > 7.2* 7.9* 8.3* 9.5* 9.0*  HCT 41.9 31.1* 25.4*   < > 23.4* 25.6* 27.4* 30.3* 29.3*  MCV 78.3* 76.6* 76.7*  --   --   --  77.2*  --  75.5*  PLT 566* 294 397  --   --   --  426*  --  411*   < > = values in this interval not displayed.     Basic Metabolic Panel: Recent Labs  Lab 09/14/21 2150 09/14/21 2150 09/15/21 0457 09/16/21 0419 09/16/21 1131 09/16/21 1257 09/17/21 0307 09/18/21 0248  NA 137  --  137 141  --   --  135 134*  K 4.4  --  3.3* 3.2* 5.9* 3.9 3.4* 3.6  CL 104  --  109 116*  --   --  111 107  CO2 25  --  22 22  --   --  16* 22   GLUCOSE 97  --  110* 84  --   --  72 133*  BUN 18  --  11 8  --   --  6 <5*  CREATININE 0.67  --  0.41* 0.43*  --   --  0.41* 0.46  CALCIUM 8.9  --  7.5* 6.8*  --   --  7.0* 8.0*  MG  --   --  1.8 2.1  --   --  1.7 1.8  PHOS  --    < > 2.0* 2.1* 8.0* 2.7 2.6 2.9   < > = values in this interval not displayed.    GFR: Estimated Creatinine Clearance: 55.4 mL/min (by C-G formula based on SCr of 0.46 mg/dL). Recent Labs  Lab 09/15/21 0457 09/15/21 0558 09/16/21 0419 09/17/21 0307 09/18/21 0248  WBC  --  8.7 8.3 8.1 7.2  LATICACIDVEN 0.6  --   --   --   --      Liver Function Tests: Recent Labs  Lab 09/14/21 2150 09/15/21 0457 09/16/21 0419  AST 36 46* 29  ALT 17 21 15   ALKPHOS 70 53 49  BILITOT 0.9 0.7  0.6  PROT 8.4* 6.6 5.0*  ALBUMIN 4.3 3.5 2.6*    No results for input(s): "LIPASE", "AMYLASE" in the last 168 hours. No results for input(s): "AMMONIA" in the last 168 hours.  ABG    Component Value Date/Time   PHART 7.47 (H) 09/15/2021 0457   PCO2ART 33 09/15/2021 0457   PO2ART 192 (H) 09/15/2021 0457   HCO3 24.0 09/15/2021 0457   O2SAT 99.8 09/15/2021 0457     Coagulation Profile: Recent Labs  Lab 09/15/21 0457  INR 1.1     Cardiac Enzymes: Recent Labs  Lab 09/14/21 2150 09/15/21 0457 09/16/21 0419 09/17/21 1035  CKTOTAL 192 1,903* 672* 3,495*     HbA1C: No results found for: "HGBA1C"  CBG: Recent Labs  Lab 09/17/21 1143 09/17/21 1546 09/17/21 1929 09/17/21 2341 09/18/21 0717  GLUCAP 139* 125* 151* 142* 120*     Review of Systems:   Positives in BOLD: Pt currently denies all complaints Gen: Denies fever, chills, weight change, fatigue, night sweats HEENT: Denies blurred vision, double vision, hearing loss, tinnitus, sinus congestion, rhinorrhea, sore throat, neck stiffness, dysphagia PULM: Denies shortness of breath, cough, sputum production, hemoptysis, wheezing CV: Denies chest pain, edema, orthopnea, paroxysmal nocturnal  dyspnea, palpitations GI: Denies abdominal pain, nausea, vomiting, diarrhea, hematochezia, melena, constipation, change in bowel habits GU: Denies dysuria, hematuria, polyuria, oliguria, urethral discharge Endocrine: Denies hot or cold intolerance, polyuria, polyphagia or appetite change Derm: Denies rash, dry skin, scaling or peeling skin change Heme: Denies easy bruising, bleeding, bleeding gums Neuro: Denies headache, numbness, weakness, slurred speech, loss of memory or consciousness   Past Medical History  She,  has a past medical history of ADHD (attention deficit hyperactivity disorder), Anxiety, Asthma, and Kidney stone.   Surgical History    Past Surgical History:  Procedure Laterality Date   ELBOW FRACTURE SURGERY     EYE MUSCLE SURGERY Bilateral    OVARIAN CYST REMOVAL Right    TONSILLECTOMY       Social History   reports that she has been smoking cigarettes. She has been smoking an average of .5 packs per day. She has never used smokeless tobacco. She reports that she does not currently use alcohol. She reports that she does not currently use drugs.   Family History   Her family history is not on file.   Allergies Allergies  Allergen Reactions   Metronidazole Nausea And Vomiting   Prednisone Other (See Comments)    irritable/ angry and very lethargic when finishes     Home Medications  Prior to Admission medications   Medication Sig Start Date End Date Taking? Authorizing Provider  ADDERALL XR 30 MG 24 hr capsule Take 30 mg by mouth 2 (two) times daily. 09/01/20   [provider]  buprenorphine (SUBUTEX) 8 MG SUBL SL tablet Place 8 mg under the tongue 2 (two) times daily. 09/09/20   [provider]  buPROPion (WELLBUTRIN SR) 150 MG 12 hr tablet Take 150 mg by mouth every morning. 08/21/20   [provider]  escitalopram (LEXAPRO) 20 MG tablet Take 20 mg by mouth daily. 09/01/20   [provider]  etonogestrel-ethinyl estradiol  (NUVARING) 0.12-0.015 MG/24HR vaginal ring Insert vaginally and leave in place for 3 consecutive weeks, then remove for 1 week. 10/31/20   Vena Austria, MD  ibuprofen (ADVIL) 600 MG tablet Take 1 tablet (600 mg total) by mouth every 6 (six) hours. 09/19/20   Mirna Mires, CNM  methylergonovine (METHERGINE) 0.2 MG tablet Take  1 tablet (0.2 mg total) by mouth 3 (three) times daily. 10/01/20   Nadara Mustard, MD  nitrofurantoin, macrocrystal-monohydrate, (MACROBID) 100 MG capsule Take 1 capsule (100 mg total) by mouth every 12 (twelve) hours. Patient not taking: Reported on 10/31/2020 10/01/20   Nadara Mustard, MD  promethazine (PHENERGAN) 25 MG suppository Place rectally. 10/27/20   [provider]  traZODone (DESYREL) 100 MG tablet Take 200 mg by mouth at bedtime as needed. 09/13/20   [provider]    Scheduled Meds:  Chlorhexidine Gluconate Cloth  6 each Topical Daily   gabapentin  200 mg Oral BID   pantoprazole  40 mg Intravenous Q12H   PHENObarbital  32.4 mg Intravenous Q8H   traZODone  100 mg Oral QHS   Continuous Infusions:  ampicillin-sulbactam (UNASYN) IV 3 g (09/18/21 4742)   dexmedetomidine (PRECEDEX) IV infusion 1.4 mcg/kg/hr (09/18/21 0151)   dextrose 5% lactated ringers 100 mL/hr at 09/18/21 0151   PRN Meds:.docusate sodium, mouth rinse, polyethylene glycol   Active Hospital Problem list     Assessment & Plan:    Acute Hypoxic Respiratory Failure in the setting of Acute Drug Intoxication and possible Aspiration Event EXTUBATED 09/17/21 -Supplemental O2 as needed to maintain O2 sats >92% -Follow intermittent Chest X-ray & ABG as needed -ABX as above for aspiration -Aggressive Pulmonary toilet as able  Toxic-Metabolic Encephalopathy secondary to Drug Intoxication Drug Induced Psychosis  Hx: ADD, polysubstance abuse, opiate dependence on Subutex, Wellbutrin  CT Head negative for acute intracranial abnormality - Seizure precautions  - Continue  supportive care  -Continue Phenobarbital taper -Precedex as needed, wean as tolerated -Psychiatry consult and sitter at bedside pt currently Involuntarily Committed  -Polysubstance abuse cessation counseling  Acute rhabdomyolysis~improving  Hypokalemia Hypomagnesia  -Monitor I&O's / urinary output -Follow BMP -Ensure adequate renal perfusion -Avoid nephrotoxic agents as able -Replace electrolytes as indicated -Pharmacy following for assistance with electrolyte replacement -IV fluids -Trend CK  Acute blood loss anemia: Coffee ground emesis per OGT~improving, no further episodes - H&H q6hrs  - Gastric occult blood results pending  - Monitor for s/sx of bleeding and transfuse for hgb <7 - Avoid chemical VTE px; subq lovenox discontinued  - Continue intermittent protonix q12hrs   Anxiety and depression - Hold Lexapro, Wellbutrin and trazodone for now -Will defer restarting home meds to psych   Best practice:  Diet:  Advance as tolerated Pain/Anxiety/Delirium protocol (if indicated): N/A VAP protocol (if indicated): N/A DVT prophylaxis: Contraindicated GI prophylaxis: PPI Glucose control: N/A Central venous access:  will see if can d/c CVC on 9/4 Arterial line:  N/A Foley:  Yes, and it is still needed Mobility:  Mobilize as able PT consulted: NO Last date of multidisciplinary goals of care discussion [9/1] Code Status:  full code Disposition: ICU   Critical care time: 40 minutes    Harlon Ditty, AGACNP-BC [facility] Prefer epic messenger for cross cover needs If after hours, please call E-link

## 2021-09-18 NOTE — TOC Initial Note (Signed)
Transition of Care [facility]) - Initial/Assessment Note    Patient Details  Name: October Peery MRN: [id number] Date of Birth: [birth year]  [facility]) CM/SW Contact:    Allayne Butcher, RN Phone Number: 09/18/2021, 10:54 AM  Clinical Narrative:                 Patient involuntary committed for drug overdose.  Patient extubated yesterday.  Psychiatry is consulted.   TOC will follow for needs.    Expected Discharge Plan: Psychiatric Hospital Barriers to Discharge: Continued Medical Work up   Patient Goals and CMS Choice        Expected Discharge Plan and Services Expected Discharge Plan: Psychiatric Hospital       Living arrangements for the past 2 months: Single Family Home                                      Prior Living Arrangements/Services Living arrangements for the past 2 months: Single Family Home   Patient language and need for interpreter reviewed:: Yes        Need for Family Participation in Patient Care: Yes (Comment) Care giver support system in place?: Yes (comment) (mother)   Criminal Activity/Legal Involvement Pertinent to Current Situation/Hospitalization: No - Comment as needed  Activities of Daily Living      Permission Sought/Granted                  Emotional Assessment         Alcohol / Substance Use: Illicit Drugs Psych Involvement: Yes (comment)  Admission diagnosis:  Drug overdose [T50.901A] Agitation [R45.1] 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] Cocaine use [F14.90] Marijuana use [F12.90] Drug-induced delirium [R41.0, T50.905A] Psychosis, unspecified psychosis type (HCC) [F29] Patient Active Problem List   Diagnosis Date Noted   Drug overdose 09/15/2021   Amphetamine abuse (HCC)    Cocaine use    Poisoning by cocaine, intentional self-harm (HCC)    Excessive postpartum bleeding 09/30/2020   Substance abuse affecting pregnancy in third trimester, antepartum 09/19/2020   Vaginal delivery 09/18/2020    Postpartum care following vaginal delivery 09/18/2020   Supervision of high risk pregnancy in third trimester    [redacted] weeks gestation of pregnancy    Encounter for postpartum care after unplanned out of hospital delivery    Supervision of high risk pregnancy, antepartum 09/13/2020   Late prenatal care in third trimester 09/13/2020   No-show for appointment 01/14/2020   ADD (attention deficit disorder) 07/30/2016   Anxiety and depression 07/30/2016   Hypothyroidism 07/30/2016   Polysubstance dependence (HCC) 07/30/2016   Migraine 07/30/2016   PCP:  Patient, No Pcp Per Pharmacy:   [id number] [place], [place] - [address] [place] [address] Phone: [phone] Fax: [phone]  [facility] [address], [place] - [address] [place] [address] Phone: [phone] Fax: [phone]     Social Determinants of Health (SDOH) Interventions    Readmission Risk Interventions     No data to display

## 2021-09-18 NOTE — Consult Note (Signed)
Client was too drowsy to assess on assessment.  The sitter reported she has been sleeping since this morning for her.  The previous sitter reported she sat up once and was agitated briefly prior to returning to sleep.  Psych will continue to try and assess.  Nanine Means, PMHNP

## 2021-09-18 NOTE — Consult Note (Signed)
PHARMACY CONSULT NOTE  Pharmacy Consult for Electrolyte Monitoring and Replacement   Recent Labs: Potassium (mmol/L)  Date Value  09/18/2021 3.6   Magnesium (mg/dL)  Date Value  29/24/4628 1.8   Calcium (mg/dL)  Date Value  63/81/7711 8.0 (L)   Albumin (g/dL)  Date Value  65/79/0383 2.6 (L)   Phosphorus (mg/dL)  Date Value  33/83/2919 2.9   Sodium (mmol/L)  Date Value  09/18/2021 134 (L)   Corrected Ca: 9 mg/dL  Assessment: 36 y.o female with significant PMH of anxiety and depression, polysubstance abuse disorder, pyelonephritis, hypothyroidism, ADD, asthma, migraine headaches who presented to the ED on 8/31 with chief complaints of extreme agitation and possible drug withdrawals. On D5/LR @ 100 ml/hr. Pt is intubated.   Goal of Therapy:  WNL  Plan:  No replacement warranted at this time.  Recheck electrolytes in am  Gardner Candle, PharmD, BCPS Clinical Pharmacist 09/18/2021 7:12 AM

## 2021-09-19 DIAGNOSIS — F29 Unspecified psychosis not due to a substance or known physiological condition: Secondary | ICD-10-CM

## 2021-09-19 DIAGNOSIS — T50905A Adverse effect of unspecified drugs, medicaments and biological substances, initial encounter: Secondary | ICD-10-CM

## 2021-09-19 DIAGNOSIS — F32A Depression, unspecified: Secondary | ICD-10-CM | POA: Diagnosis not present

## 2021-09-19 DIAGNOSIS — F419 Anxiety disorder, unspecified: Secondary | ICD-10-CM | POA: Diagnosis not present

## 2021-09-19 DIAGNOSIS — R410 Disorientation, unspecified: Secondary | ICD-10-CM

## 2021-09-19 DIAGNOSIS — F1920 Other psychoactive substance dependence, uncomplicated: Secondary | ICD-10-CM | POA: Diagnosis not present

## 2021-09-19 LAB — BASIC METABOLIC PANEL
Anion gap: 8 (ref 5–15)
BUN: <5 mg/dL — ABNORMAL LOW (ref 6–20)
CO2: 23 mmol/L (ref 22–32)
Calcium: 8.1 mg/dL — ABNORMAL LOW (ref 8.9–10.3)
Chloride: 109 mmol/L (ref 98–111)
Creatinine, Ser: 0.48 mg/dL (ref 0.44–1.00)
GFR, Estimated: >60 mL/min (ref >60)
Glucose, Bld: 118 mg/dL — ABNORMAL HIGH (ref 70–99)
Potassium: 3.3 mmol/L — ABNORMAL LOW (ref 3.5–5.1)
Sodium: 140 mmol/L (ref 135–145)

## 2021-09-19 LAB — CK: Total CK: 1375 U/L — ABNORMAL HIGH (ref 38–234)

## 2021-09-19 LAB — MAGNESIUM: Magnesium: 1.5 mg/dL — ABNORMAL LOW (ref 1.7–2.4)

## 2021-09-19 LAB — CBC
HCT: 27.5 % — ABNORMAL LOW (ref 36.0–46.0)
Hemoglobin: 8.6 g/dL — ABNORMAL LOW (ref 12.0–15.0)
MCH: 23.7 pg — ABNORMAL LOW (ref 26.0–34.0)
MCHC: 31.3 g/dL (ref 30.0–36.0)
MCV: 75.8 fL — ABNORMAL LOW (ref 80.0–100.0)
Platelets: 381 10*3/uL (ref 150–400)
RBC: 3.63 MIL/uL — ABNORMAL LOW (ref 3.87–5.11)
RDW: 16.4 % — ABNORMAL HIGH (ref 11.5–15.5)
WBC: 6 10*3/uL (ref 4.0–10.5)
nRBC: 0 % (ref 0.0–0.2)

## 2021-09-19 LAB — LEGIONELLA PNEUMOPHILA SEROGP 1 UR AG: L. pneumophila Serogp 1 Ur Ag: NEGATIVE

## 2021-09-19 LAB — PHOSPHORUS: Phosphorus: 2.7 mg/dL (ref 2.5–4.6)

## 2021-09-19 MED ORDER — LACTATED RINGERS IV SOLN: INTRAVENOUS | Status: DC

## 2021-09-19 MED ORDER — MAGNESIUM SULFATE 4 GM/100ML IV SOLN
4.0000 g | Freq: Once | INTRAVENOUS | Status: AC
  Administered 2021-09-19: 4 g via INTRAVENOUS
  Filled 2021-09-19: qty 100

## 2021-09-19 MED ORDER — CLONIDINE HCL 0.1 MG PO TABS: 0.3000 mg | ORAL_TABLET | Freq: Two times a day (BID) | ORAL | Status: DC

## 2021-09-19 MED ORDER — ESCITALOPRAM OXALATE 10 MG PO TABS
20.0000 mg | ORAL_TABLET | Freq: Every day | ORAL | Status: DC
  Administered 2021-09-19 – 2021-09-21 (×3): 20 mg via ORAL
  Filled 2021-09-19 (×3): qty 1

## 2021-09-19 MED ORDER — POTASSIUM CHLORIDE CRYS ER 20 MEQ PO TBCR
40.0000 meq | EXTENDED_RELEASE_TABLET | Freq: Once | ORAL | Status: AC
Start: 2021-09-19 — End: 2021-09-19
  Administered 2021-09-19: 40 meq via ORAL
  Filled 2021-09-19: qty 2

## 2021-09-19 MED ORDER — MUPIROCIN 2 % EX OINT
1.0000 | TOPICAL_OINTMENT | Freq: Two times a day (BID) | CUTANEOUS | Status: DC
  Administered 2021-09-19 – 2021-09-21 (×5): 1 via NASAL
  Filled 2021-09-19: qty 22

## 2021-09-19 MED ORDER — MAGNESIUM SULFATE 2 GM/50ML IV SOLN
2.0000 g | Freq: Once | INTRAVENOUS | Status: DC
  Filled 2021-09-19: qty 50

## 2021-09-19 MED ORDER — CLONIDINE HCL 0.1 MG PO TABS
0.3000 mg | ORAL_TABLET | Freq: Three times a day (TID) | ORAL | Status: AC
  Administered 2021-09-19 – 2021-09-20 (×4): 0.3 mg via ORAL
  Filled 2021-09-19 (×4): qty 3

## 2021-09-19 MED ORDER — CLONIDINE HCL 0.1 MG PO TABS: 0.3000 mg | ORAL_TABLET | Freq: Every day | ORAL | Status: DC

## 2021-09-19 MED ORDER — CLONIDINE HCL 0.1 MG PO TABS
0.3000 mg | ORAL_TABLET | Freq: Three times a day (TID) | ORAL | Status: DC
Start: 2021-09-19 — End: 2021-09-19

## 2021-09-19 NOTE — Progress Notes (Signed)
Pt experienced intense period of physical agitation.  Pt maintained orientation and mentation but was unable to control strong, peripheral movements.  She denied pain and described it as antsiness.  Dr. Aundria Rud notified and retimed clonidine order for the time of the event.  After, pt began resting within 20 minutes of administration.

## 2021-09-19 NOTE — Consult Note (Signed)
[facility] Face-to-Face Psychiatry Consult   Reason for Consult:  drug use Referring Physician:  EDP Patient Identification: Cassandra [name] MRN:  [id number] Principal Diagnosis: Drug overdose Diagnosis:  Principal Problem:   Drug overdose Active Problems:   Anxiety and depression   Polysubstance dependence (HCC)   Hypothyroidism   Amphetamine abuse (HCC)   Cocaine use   Poisoning by cocaine, intentional self-harm (HCC)   Drug-induced delirium   Psychosis (HCC)   Total Time spent with patient: 45 minutes  Subjective:   Cassandra [name] is a 36 y.o. female patient admitted with drug-induced delirium.  HPI:  36 yo female presented to the ED after smoking something "funky" with her partner.  They both ended up in the ED.  He claimed they were using opiates and used Suboxone to close to getting high.  He stabilized and discharged.  She sees Dr Carmina Miller for Suboxone and feels much better since they restarted this and her Lexapro.  Denies depression since starting Lexapro.  She was having some restlessness prior to assessment, calmed with Ativan.  The goal is to wean her off of the Precedex with Ativan.  Denies suicidal/homicidal ideations and hallucinations, no needs voiced, not interested in rehab.  She feels the Suboxone is all she needs, encouragement provided, client declined.  Past Psychiatric History: polysubstance use disorder  Risk to Self:  none Risk to Others:  none Prior Inpatient Therapy:  past rehabs Prior Outpatient Therapy:  Dr Joellyn Quails  Past Medical History:  Past Medical History:  Diagnosis Date   ADHD (attention deficit hyperactivity disorder)    Anxiety    Asthma    Kidney stone     Past Surgical History:  Procedure Laterality Date   ELBOW FRACTURE SURGERY     EYE MUSCLE SURGERY Bilateral    OVARIAN CYST REMOVAL Right    TONSILLECTOMY     Family History: History reviewed. No pertinent family history. Family Psychiatric  History: none Social History:  Social History    Substance and Sexual Activity  Alcohol Use Not Currently     Social History   Substance and Sexual Activity  Drug Use Not Currently    Social History   Socioeconomic History   Marital status: Single    Spouse name: Not on file   Number of children: Not on file   Years of education: Not on file   Highest education level: Not on file  Occupational History   Occupation: delivery driver    Comment: door dash  Tobacco Use   Smoking status: Every Day    Packs/day: 0.50    Types: Cigarettes   Smokeless tobacco: Never  Vaping Use   Vaping Use: Never used  Substance and Sexual Activity   Alcohol use: Not Currently   Drug use: Not Currently   Sexual activity: Not Currently  Other Topics Concern   Not on file  Social History Narrative   Not on file   Social Determinants of Health   Financial Resource Strain: Not on file  Food Insecurity: Not on file  Transportation Needs: Not on file  Physical Activity: Not on file  Stress: Not on file  Social Connections: Not on file   Additional Social History:    Allergies:   Allergies  Allergen Reactions   Metronidazole Nausea And Vomiting   Prednisone Other (See Comments)    irritable/ angry and very lethargic when finishes    Labs:  Results for orders placed or performed during the hospital encounter of 09/14/21 (from the  past 48 hour(s))  Glucose, capillary     Status: Abnormal   Collection Time: 09/17/21  7:29 PM  Result Value Ref Range   Glucose-Capillary 151 (H) 70 - 99 mg/dL    Comment: Glucose reference range applies only to samples taken after fasting for at least 8 hours.  Glucose, capillary     Status: Abnormal   Collection Time: 09/17/21 11:41 PM  Result Value Ref Range   Glucose-Capillary 142 (H) 70 - 99 mg/dL    Comment: Glucose reference range applies only to samples taken after fasting for at least 8 hours.  CK     Status: Abnormal   Collection Time: 09/18/21  2:47 AM  Result Value Ref Range   Total  CK 2,913 (H) 38 - 234 U/L    Comment: Performed at [facility], [address] [place], [place] [address]  CBC     Status: Abnormal   Collection Time: 09/18/21  2:48 AM  Result Value Ref Range   WBC 7.2 4.0 - 10.5 K/uL   RBC 3.88 3.87 - 5.11 MIL/uL   Hemoglobin 9.0 (L) 12.0 - 15.0 g/dL   HCT 46.9 (L) 62.9 - 52.8 %   MCV 75.5 (L) 80.0 - 100.0 fL   MCH 23.2 (L) 26.0 - 34.0 pg   MCHC 30.7 30.0 - 36.0 g/dL   RDW 41.3 (H) 24.4 - 01.0 %   Platelets 411 (H) 150 - 400 K/uL   nRBC 0.0 0.0 - 0.2 %    Comment: Performed at [facility], [address] [place], [place] [address]  Basic metabolic panel     Status: Abnormal   Collection Time: 09/18/21  2:48 AM  Result Value Ref Range   Sodium 134 (L) 135 - 145 mmol/L   Potassium 3.6 3.5 - 5.1 mmol/L   Chloride 107 98 - 111 mmol/L   CO2 22 22 - 32 mmol/L   Glucose, Bld 133 (H) 70 - 99 mg/dL    Comment: Glucose reference range applies only to samples taken after fasting for at least 8 hours.   BUN <5 (L) 6 - 20 mg/dL   Creatinine, Ser 6.64 0.44 - 1.00 mg/dL   Calcium 8.0 (L) 8.9 - 10.3 mg/dL   GFR, Estimated >40 >34 mL/min    Comment: (NOTE) Calculated using the CKD-EPI Creatinine Equation (2021)    Anion gap 5 5 - 15    Comment: Performed at [facility], [address], [place], [place] [address]  Magnesium     Status: None   Collection Time: 09/18/21  2:48 AM  Result Value Ref Range   Magnesium 1.8 1.7 - 2.4 mg/dL    Comment: Performed at [facility], [address], [place], [place] [address]  Phosphorus     Status: None   Collection Time: 09/18/21  2:48 AM  Result Value Ref Range   Phosphorus 2.9 2.5 - 4.6 mg/dL    Comment: Performed at [facility], [address] [place], [place] [address]  Glucose, capillary     Status: Abnormal   Collection Time: 09/18/21  7:17 AM  Result Value Ref Range   Glucose-Capillary 120 (H) 70 - 99 mg/dL    Comment: Glucose reference range applies  only to samples taken after fasting for at least 8 hours.  Glucose, capillary     Status: Abnormal   Collection Time: 09/18/21 11:25 AM  Result Value Ref Range   Glucose-Capillary 139 (H) 70 - 99 mg/dL  Comment: Glucose reference range applies only to samples taken after fasting for at least 8 hours.  Glucose, capillary     Status: Abnormal   Collection Time: 09/18/21  3:16 PM  Result Value Ref Range   Glucose-Capillary 144 (H) 70 - 99 mg/dL    Comment: Glucose reference range applies only to samples taken after fasting for at least 8 hours.  Glucose, capillary     Status: Abnormal   Collection Time: 09/18/21  7:41 PM  Result Value Ref Range   Glucose-Capillary 144 (H) 70 - 99 mg/dL    Comment: Glucose reference range applies only to samples taken after fasting for at least 8 hours.  Glucose, capillary     Status: Abnormal   Collection Time: 09/18/21 11:41 PM  Result Value Ref Range   Glucose-Capillary 120 (H) 70 - 99 mg/dL    Comment: Glucose reference range applies only to samples taken after fasting for at least 8 hours.  Magnesium     Status: Abnormal   Collection Time: 09/19/21  5:50 AM  Result Value Ref Range   Magnesium 1.5 (L) 1.7 - 2.4 mg/dL    Comment: Performed at [facility], [address] [place], [place] [address]  Phosphorus     Status: None   Collection Time: 09/19/21  5:50 AM  Result Value Ref Range   Phosphorus 2.7 2.5 - 4.6 mg/dL    Comment: Performed at [facility], [address] [place], [place] [address]  CK     Status: Abnormal   Collection Time: 09/19/21  5:50 AM  Result Value Ref Range   Total CK 1,375 (H) 38 - 234 U/L    Comment: Performed at [facility], [address] [place], [place] [address]  CBC     Status: Abnormal   Collection Time: 09/19/21  5:50 AM  Result Value Ref Range   WBC 6.0 4.0 - 10.5 K/uL   RBC 3.63 (L) 3.87 - 5.11 MIL/uL   Hemoglobin 8.6 (L) 12.0 - 15.0 g/dL    Comment: Reticulocyte  Hemoglobin testing may be clinically indicated, consider ordering this additional test [id number]    HCT 27.5 (L) 36.0 - 46.0 %   MCV 75.8 (L) 80.0 - 100.0 fL   MCH 23.7 (L) 26.0 - 34.0 pg   MCHC 31.3 30.0 - 36.0 g/dL   RDW 64.4 (H) 03.4 - 74.2 %   Platelets 381 150 - 400 K/uL   nRBC 0.0 0.0 - 0.2 %    Comment: Performed at [facility], [address] [place], [place] [address]  Basic metabolic panel     Status: Abnormal   Collection Time: 09/19/21  5:50 AM  Result Value Ref Range   Sodium 140 135 - 145 mmol/L   Potassium 3.3 (L) 3.5 - 5.1 mmol/L   Chloride 109 98 - 111 mmol/L   CO2 23 22 - 32 mmol/L   Glucose, Bld 118 (H) 70 - 99 mg/dL    Comment: Glucose reference range applies only to samples taken after fasting for at least 8 hours.   BUN <5 (L) 6 - 20 mg/dL   Creatinine, Ser 8.75 0.44 - 1.00 mg/dL   Calcium 8.1 (L) 8.9 - 10.3 mg/dL   GFR, Estimated >64 >33 mL/min    Comment: (NOTE) Calculated using the CKD-EPI Creatinine Equation (2021)    Anion gap 8 5 - 15    Comment: Performed at [facility], [address], [place], [place]  [phone]    Current Facility-Administered Medications  Medication Dose Route Frequency Provider Last Rate Last Admin   buprenorphine (SUBUTEX) SL tablet 4 mg  4 mg Sublingual QPM Drusilla KannerMitchell, Devan, RPH   4 mg at 09/18/21 1720   buprenorphine (SUBUTEX) SL tablet 8 mg  8 mg Sublingual Daily Harlon DittyKeene, Jeremiah D, NP   8 mg at 09/19/21 1005   Chlorhexidine Gluconate Cloth 2 % PADS 6 each  6 each Topical Daily Erin FullingKasa, Kurian, MD   6 each at 09/19/21 1006   cloNIDine (CATAPRES) tablet 0.3 mg  0.3 mg Oral TID Martyn MalayBeers, Brandon D, RPH   0.3 mg at 09/19/21 1256   Followed by   Melene Muller[START ON 09/21/2021] cloNIDine (CATAPRES) tablet 0.3 mg  0.3 mg Oral BID Martyn MalayBeers, Brandon D, RPH       Followed by   Melene Muller[START ON 09/23/2021] cloNIDine (CATAPRES) tablet 0.3 mg  0.3 mg Oral Daily Beers, Brandon D, RPH       dexmedetomidine (PRECEDEX) 400 MCG/100ML (4 mcg/mL)  infusion  0.4-1.5 mcg/kg/hr Intravenous Titrated Rust-Chester, Britton L, NP 13.54 mL/hr at 09/19/21 1000 1.5 mcg/kg/hr at 09/19/21 1000   docusate sodium (COLACE) capsule 100 mg  100 mg Oral BID PRN Jimmye Normanuma, Elizabeth Achieng, NP       feeding supplement (ENSURE ENLIVE / ENSURE PLUS) liquid 237 mL  237 mL Oral TID BM Salena SanerGonzalez, Carmen L, MD   237 mL at 09/19/21 1006   gabapentin (NEURONTIN) capsule 400 mg  400 mg Oral BID Harlon DittyKeene, Jeremiah D, NP   400 mg at 09/19/21 1005   lactated ringers infusion   Intravenous Continuous Harlon DittyKeene, Jeremiah D, NP 100 mL/hr at 09/19/21 1252 New Bag at 09/19/21 1252   LORazepam (ATIVAN) injection 2 mg  2 mg Intravenous Q4H PRN Harlon DittyKeene, Jeremiah D, NP   2 mg at 09/19/21 1105   multivitamin with minerals tablet 1 tablet  1 tablet Oral Daily Salena SanerGonzalez, Carmen L, MD   1 tablet at 09/19/21 1006   mupirocin ointment (BACTROBAN) 2 % 1 Application  1 Application Nasal BID Raechel Chutegayli, Khabib, MD   1 Application at 09/19/21 1259   Oral care mouth rinse  15 mL Mouth Rinse PRN Erin FullingKasa, Kurian, MD       pantoprazole (PROTONIX) injection 40 mg  40 mg Intravenous Q12H Ezequiel EssexNelson, Dana G, NP   40 mg at 09/19/21 1002   polyethylene glycol (MIRALAX / GLYCOLAX) packet 17 g  17 g Oral Daily PRN Jimmye Normanuma, Elizabeth Achieng, NP       traZODone (DESYREL) tablet 100 mg  100 mg Oral QHS Rust-Chester, Britton L, NP   100 mg at 09/18/21 2231    Musculoskeletal: Strength & Muscle Tone: within normal limits Gait & Station: did not witness Patient leans: N/A  Psychiatric Specialty Exam: Physical Exam Vitals and nursing note reviewed.  HENT:     Head: Normocephalic.     Nose: Nose normal.  Pulmonary:     Effort: Pulmonary effort is normal.  Musculoskeletal:        General: Normal range of motion.     Cervical back: Normal range of motion.  Neurological:     General: No focal deficit present.     Mental Status: She is alert and oriented to person, place, and time.  Psychiatric:        Attention and  Perception: Attention and perception normal.        Mood and Affect: Mood is anxious.        Speech: Speech  normal.        Behavior: Behavior normal. Behavior is cooperative.        Thought Content: Thought content normal.        Cognition and Memory: Cognition and memory normal.        Judgment: Judgment normal.     Review of Systems  Psychiatric/Behavioral:  Positive for substance abuse. The patient is nervous/anxious.   All other systems reviewed and are negative.   Blood pressure 103/67, pulse 86, temperature 97.9 F (36.6 C), temperature source Oral, resp. rate 20, height 5\' 2"  (1.575 m), weight 36.1 kg, SpO2 99 %, not currently breastfeeding.Body mass index is 14.56 kg/m.  General Appearance: Disheveled  Eye Contact:  Good  Speech:  Normal Rate  Volume:  Normal  Mood:  Anxious  Affect:  Congruent  Thought Process:  Coherent  Orientation:  Full (Time, Place, and Person)  Thought Content:  WDL and Logical  Suicidal Thoughts:  No  Homicidal Thoughts:  No  Memory:  Immediate;   Fair Recent;   Fair Remote;   Fair  Judgement:  Fair  Insight:  Lacking  Psychomotor Activity:  Increased  Concentration:  Concentration: Fair and Attention Span: Fair  Recall:  of Knowledge:  Fair  Language:  Good  Akathisia:  No  Handed:  Right  AIMS (if indicated):     Assets:  Housing Leisure Time Resilience Social Support  ADL's:  Intact  Cognition:  WNL  Sleep:        Physical Exam: Physical Exam Vitals and nursing note reviewed.  HENT:     Head: Normocephalic.     Nose: Nose normal.  Pulmonary:     Effort: Pulmonary effort is normal.  Musculoskeletal:        General: Normal range of motion.     Cervical back: Normal range of motion.  Neurological:     General: No focal deficit present.     Mental Status: She is alert and oriented to person, place, and time.  Psychiatric:        Attention and Perception: Attention and perception normal.        Mood and Affect:  Mood is anxious.        Speech: Speech normal.        Behavior: Behavior normal. Behavior is cooperative.        Thought Content: Thought content normal.        Cognition and Memory: Cognition and memory normal.        Judgment: Judgment normal.    Review of Systems  Psychiatric/Behavioral:  Positive for substance abuse. The patient is nervous/anxious.   All other systems reviewed and are negative.  Blood pressure 103/67, pulse 86, temperature 97.9 F (36.6 C), temperature source Oral, resp. rate 20, height 5\' 2"  (1.575 m), weight 36.1 kg, SpO2 99 %, not currently breastfeeding. Body mass index is 14.56 kg/m.  Treatment Plan Summary: Polysubstance use disorder, including opiate use: Continue Suboxone  Depression and anxiety Continue Lexapro 20 mg daily  Insomnia: Trazodone 100 mg daily at bedtime  Disposition: No evidence of imminent risk to self or others at present.   Patient does not meet criteria for psychiatric inpatient admission.  [employer], NP 09/19/2021 5:23 PM

## 2021-09-19 NOTE — Consult Note (Signed)
PHARMACY CONSULT NOTE  Pharmacy Consult for Electrolyte Monitoring and Replacement   Recent Labs: Potassium (mmol/L)  Date Value  09/19/2021 3.3 (L)   Magnesium (mg/dL)  Date Value  26/83/4196 1.5 (L)   Calcium (mg/dL)  Date Value  22/29/7989 8.1 (L)   Albumin (g/dL)  Date Value  21/19/4174 2.6 (L)   Phosphorus (mg/dL)  Date Value  08/28/4816 2.7   Sodium (mmol/L)  Date Value  09/19/2021 140   Corrected Ca: 9 mg/dL  Assessment: 36 y.o female with significant PMH of anxiety and depression, polysubstance abuse disorder, pyelonephritis, hypothyroidism, ADD, asthma, migraine headaches who presented to the ED on 8/31 with chief complaints of extreme agitation and possible drug withdrawals. On D5/LR @ 100 ml/hr. Pt is intubated.   Goal of Therapy:  WNL  Plan:  K: 3.6 > 3.3 - replete with KCL PO x1 Mg: 1.8>1.5 - MgSO 2g IV x1 Recheck electrolytes in am  Martyn Malay, PharmD, [facility] Clinical Pharmacist 09/19/2021 7:45 AM

## 2021-09-19 NOTE — Progress Notes (Signed)
NAME:  Cassandra [name], MRN:  [id number], DOB:  April 23, 1985, LOS: 4 ADMISSION DATE:  09/14/2021, CONSULTATION DATE:  09/15/2021 REFERRING MD: Chiquita Loth, MD, CHIEF COMPLAINT:  Drug Induced Psychosis   HPI  36 y.o female with significant PMH of anxiety and depression, polysubstance abuse disorder, pyelonephritis, hypothyroidism, ADD, asthma, migraine headaches who presented to the ED on 8/31 with chief complaints of extreme agitation and possible drug withdrawals.  Per ED notes, patient arrived from home via [facility] screaming and yelling incoherently.  Patient reported to ED staff that she takes Buprenorphine and went to pick it up from the pharmacy but claimed she was dispensed the wrong medication.  Patient reported that she thinks she is going through withdrawals.  She arrived with bottles of Lexapro and amphetamine salts which apparently is not prescribed to her.  Patient admitted to taking methamphetamine and fentanyl the previous day.  ED Course: In the emergency department, vital signs showed blood pressure of 120/85 mm Hg, tachycardia of 106 beats per minute, a respiratory rate of 22 breaths per minute, and a temperature of 98.12F (36.9C), sats 100% on RA.  Patient was extremely agitated, sitting on the bathroom floor and experiencing internal and external stimuli.  She apparently was also exhibiting some delusional thinking but still able to answer questions appropriately.  Due to  acute presentation with potential threat to life or bodily function patient was placed under involuntary commitment.  She was evaluated by psych for psychotic and paranoid behaviors.  While still holding down in the ED pending inpatient psych placement, patient went into acute paranoia screaming and yelling while seated on the floor. Patient required several doses of IV lorazepam, 4 mg of Versed, morphine 4 mg, Haldol 5 mg, 25 mg Benadryl IM, droperidol 5 mg and Geodon with no improvement.  Patient continued to thrash about  in bed extremely agitated and combative.  She was placed on four-point restraint however this was not adequate to hold her down therefore decision was made to intubate and sedate her for safety reasons.  PCCM consulted  Pertinent Labs/Diagnostics Findings: Chemistry:Na+/ K+:  Glucose: BUN/Cr.:Calcium:  AST/ALT: CBC: WBC: 8.7Hgb/Hct:9.5/31.1  Other Lab findings ABG result:  pO2 192; pCO2 33; pH 7.47;  HCO3 24, %O2 Sat 99.8.  UDS:+ Amphetamines, cocaine metabolites, and cannabinoid Imaging:  CXR> no acute cardiopulmonary process CTH> no acute intracranial abnormality  Past Medical History  Anxiety and Depression, polysubstance abuse disorder, pyelonephritis, hypothyroidism, ADD, asthma, migraine headaches  Significant Hospital Events   9/1:Admitted to ICU with drug induced psychosis and respiratory failure requiring mechanical ventilation. 9/2: WUA performed pt remains severely agitated/delirious with precedex gtt will start phenobarbital taper and resume versed/fentanyl/propofol gtts  9/3: Pt remains mechanically intubated on minimal ventilator settings.  Sedated with precedex/fentanyl/propofol/versed gtts remains intermittently agitated/delirious.  Will attempt WUA once pts mother arrives at bedside ~ EXTUBATED 9/4: Calm on Precedex, wean as tolerated.  Respiratory status stable s/p extubation yesterday.  Psych consult pending 9/5: Starting Clonidine taper, weaning Precedex. D/c Phenobarbital. Complete 5 day course of Unasyn today  Consults:  PCCM Psychiatry  Procedures:  9/1: Intubation 9/1: Right IJ central line  Significant Diagnostic Tests:  9/1: Chest Xray> no acute cardiopulmonary process 9/1: Abdominal xray>Gas-filled but nondilated small and large bowel loops may indicate ileus. 8/31: Noncontrast CT head> no acute intracranial abnormality  Micro Data:  9/1: Blood culture x2>NGTD 9/1: MRSA PCR>>positive  9/1: Strep pneumo urinary antigen>negative   Antimicrobials:  Unaysn  9/1>9/5  OBJECTIVE  Blood  pressure 112/84, pulse 63, temperature 97.9 F (36.6 C), temperature source Oral, resp. rate (!) 21, height 5\' 2"  (1.575 m), weight 36.1 kg, SpO2 99 %, not currently breastfeeding.        Intake/Output Summary (Last 24 hours) at 09/19/2021 1016 Last data filed at 09/19/2021 1000 Gross per 24 hour  Intake 4795.91 ml  Output 4250 ml  Net 545.91 ml    Filed Weights   09/14/21 2139 09/16/21 0425 09/17/21 0428  Weight: 48.5 kg 41.9 kg 36.1 kg     Physical Examination  GENERAL: Acutely-ill appearing female, laying in bed, calm and alert on precedex, NAD  HEENT: Head atraumatic, normocephalic. Oropharynx and nasopharynx clear. Supple, no JVD  LUNGS: clear throughout, even, non labored  CARDIOVASCULAR: Bradycardia, rrr, no r/g, 2+ radial/1+ distal pulses, 1+ bilateral lower extremity edema  ABDOMEN: +BS x4, soft, non tender, non distended EXTREMITIES: Normal bulk and tone, moves all extremities, no focal deficits NEUROLOGIC: Awake on precedex, move all extremities to commands, no focal deficits, oriented x3 SKIN: No obvious rash, lesion, or ulcer.   Labs/imaging that I havepersonally reviewed  (right click and "Reselect all SmartList Selections" daily)     Labs   CBC: Recent Labs  Lab 09/15/21 0558 09/16/21 0419 09/16/21 0940 09/16/21 2148 09/17/21 0307 09/17/21 1035 09/18/21 0248 09/19/21 0550  WBC 8.7 8.3  --   --  8.1  --  7.2 6.0  HGB 9.5* 7.8*   < > 7.9* 8.3* 9.5* 9.0* 8.6*  HCT 31.1* 25.4*   < > 25.6* 27.4* 30.3* 29.3* 27.5*  MCV 76.6* 76.7*  --   --  77.2*  --  75.5* 75.8*  PLT 294 397  --   --  426*  --  411* 381   < > = values in this interval not displayed.     Basic Metabolic Panel: Recent Labs  Lab 09/15/21 0457 09/16/21 0419 09/16/21 1131 09/16/21 1257 09/17/21 0307 09/18/21 0248 09/19/21 0550  NA 137 141  --   --  135 134* 140  K 3.3* 3.2* 5.9* 3.9 3.4* 3.6 3.3*  CL 109 116*  --   --  111 107 109  CO2 22 22  --   --   16* 22 23  GLUCOSE 110* 84  --   --  72 133* 118*  BUN 11 8  --   --  6 <5* <5*  CREATININE 0.41* 0.43*  --   --  0.41* 0.46 0.48  CALCIUM 7.5* 6.8*  --   --  7.0* 8.0* 8.1*  MG 1.8 2.1  --   --  1.7 1.8 1.5*  PHOS 2.0* 2.1* 8.0* 2.7 2.6 2.9 2.7    GFR: Estimated Creatinine Clearance: 55.4 mL/min (by C-G formula based on SCr of 0.48 mg/dL). Recent Labs  Lab 09/15/21 0457 09/15/21 0558 09/16/21 0419 09/17/21 0307 09/18/21 0248 09/19/21 0550  WBC  --    < > 8.3 8.1 7.2 6.0  LATICACIDVEN 0.6  --   --   --   --   --    < > = values in this interval not displayed.     Liver Function Tests: Recent Labs  Lab 09/14/21 2150 09/15/21 0457 09/16/21 0419  AST 36 46* 29  ALT 17 21 15   ALKPHOS 70 53 49  BILITOT 0.9 0.7 0.6  PROT 8.4* 6.6 5.0*  ALBUMIN 4.3 3.5 2.6*    No results for input(s): "LIPASE", "AMYLASE" in the last 168 hours. No results for  input(s): "AMMONIA" in the last 168 hours.  ABG    Component Value Date/Time   PHART 7.47 (H) 09/15/2021 0457   PCO2ART 33 09/15/2021 0457   PO2ART 192 (H) 09/15/2021 0457   HCO3 24.0 09/15/2021 0457   O2SAT 99.8 09/15/2021 0457     Coagulation Profile: Recent Labs  Lab 09/15/21 0457  INR 1.1     Cardiac Enzymes: Recent Labs  Lab 09/15/21 0457 09/16/21 0419 09/17/21 1035 09/18/21 0247 09/19/21 0550  CKTOTAL 1,903* 672* 3,495* 2,913* 1,375*     HbA1C: No results found for: "HGBA1C"  CBG: Recent Labs  Lab 09/18/21 0717 09/18/21 1125 09/18/21 1516 09/18/21 1941 09/18/21 2341  GLUCAP 120* 139* 144* 144* 120*     Review of Systems:   Positives in BOLD: Pt currently denies all complaints Gen: Denies fever, chills, weight change, fatigue, night sweats HEENT: Denies blurred vision, double vision, hearing loss, tinnitus, sinus congestion, rhinorrhea, sore throat, neck stiffness, dysphagia PULM: Denies shortness of breath, cough, sputum production, hemoptysis, wheezing CV: Denies chest pain, edema,  orthopnea, paroxysmal nocturnal dyspnea, palpitations GI: Denies abdominal pain, nausea, vomiting, diarrhea, hematochezia, melena, constipation, change in bowel habits GU: Denies dysuria, hematuria, polyuria, oliguria, urethral discharge Endocrine: Denies hot or cold intolerance, polyuria, polyphagia or appetite change Derm: Denies rash, dry skin, scaling or peeling skin change Heme: Denies easy bruising, bleeding, bleeding gums Neuro: Denies headache, numbness, weakness, slurred speech, loss of memory or consciousness   Past Medical History  She,  has a past medical history of ADHD (attention deficit hyperactivity disorder), Anxiety, Asthma, and Kidney stone.   Surgical History    Past Surgical History:  Procedure Laterality Date   ELBOW FRACTURE SURGERY     EYE MUSCLE SURGERY Bilateral    OVARIAN CYST REMOVAL Right    TONSILLECTOMY       Social History   reports that she has been smoking cigarettes. She has been smoking an average of .5 packs per day. She has never used smokeless tobacco. She reports that she does not currently use alcohol. She reports that she does not currently use drugs.   Family History   Her family history is not on file.   Allergies Allergies  Allergen Reactions   Metronidazole Nausea And Vomiting   Prednisone Other (See Comments)    irritable/ angry and very lethargic when finishes     Home Medications  Prior to Admission medications   Medication Sig Start Date End Date Taking? Authorizing Provider  ADDERALL XR 30 MG 24 hr capsule Take 30 mg by mouth 2 (two) times daily. 09/01/20   [provider]  buprenorphine (SUBUTEX) 8 MG SUBL SL tablet Place 8 mg under the tongue 2 (two) times daily. 09/09/20   [provider]  buPROPion (WELLBUTRIN SR) 150 MG 12 hr tablet Take 150 mg by mouth every morning. 08/21/20   [provider]  escitalopram (LEXAPRO) 20 MG tablet Take 20 mg by mouth daily. 09/01/20   [provider]   etonogestrel-ethinyl estradiol (NUVARING) 0.12-0.015 MG/24HR vaginal ring Insert vaginally and leave in place for 3 consecutive weeks, then remove for 1 week. 10/31/20   Vena Austria, MD  ibuprofen (ADVIL) 600 MG tablet Take 1 tablet (600 mg total) by mouth every 6 (six) hours. 09/19/20   Mirna Mires, CNM  methylergonovine (METHERGINE) 0.2 MG tablet Take 1 tablet (0.2 mg total) by mouth 3 (three) times daily. 10/01/20   Nadara Mustard, MD  nitrofurantoin, macrocrystal-monohydrate, (MACROBID) 100 MG capsule  Take 1 capsule (100 mg total) by mouth every 12 (twelve) hours. Patient not taking: Reported on 10/31/2020 10/01/20   Nadara Mustard, MD  promethazine (PHENERGAN) 25 MG suppository Place rectally. 10/27/20   [provider]  traZODone (DESYREL) 100 MG tablet Take 200 mg by mouth at bedtime as needed. 09/13/20   [provider]    Scheduled Meds:  buprenorphine  4 mg Sublingual QPM   buprenorphine  8 mg Sublingual Daily   Chlorhexidine Gluconate Cloth  6 each Topical Daily   feeding supplement  237 mL Oral TID BM   gabapentin  400 mg Oral BID   multivitamin with minerals  1 tablet Oral Daily   pantoprazole  40 mg Intravenous Q12H   PHENObarbital  32.4 mg Intravenous Q8H   traZODone  100 mg Oral QHS   Continuous Infusions:  ampicillin-sulbactam (UNASYN) IV 200 mL/hr at 09/19/21 0600   dexmedetomidine (PRECEDEX) IV infusion 1.5 mcg/kg/hr (09/19/21 0950)   dextrose 5% lactated ringers 100 mL/hr at 09/19/21 0849   magnesium sulfate bolus IVPB 4 g (09/19/21 0850)   PRN Meds:.docusate sodium, LORazepam, mouth rinse, polyethylene glycol   Active Hospital Problem list     Assessment & Plan:    Acute Hypoxic Respiratory Failure in the setting of Acute Drug Intoxication and possible Aspiration Event ~ RESOLVED  EXTUBATED 09/17/21 -Supplemental O2 as needed to maintain O2 sats >92% -Follow intermittent Chest X-ray & ABG as needed -Completed course of  Unasyn -Aggressive Pulmonary toilet as able  Toxic-Metabolic Encephalopathy secondary to Drug Intoxication ~ IMPROVING Drug Induced Psychosis ~ IMPROVING Hx: ADD, polysubstance abuse, opiate dependence on Subutex, Wellbutrin  CT Head negative for acute intracranial abnormality - Seizure precautions  - Continue supportive care  -D/c Phenobarbital  -Start Clonidine taper -Precedex as needed, wean as tolerated -Psychiatry consult pending,  sitter at bedside pt currently Involuntarily Committed  -Polysubstance abuse cessation counseling  Acute rhabdomyolysis~improving  Hypokalemia Hypomagnesia  -Monitor I&O's / urinary output -Follow BMP -Ensure adequate renal perfusion -Avoid nephrotoxic agents as able -Replace electrolytes as indicated -Pharmacy following for assistance with electrolyte replacement -IV fluids -Trend CK  Acute blood loss anemia: Coffee ground emesis per OGT~ RESOLVED, no further episodes -Monitor for S/Sx of bleeding -Trend CBC -SCD's for VTE Prophylaxis (avoid chemical prophylaxis) -Transfuse for Hgb <7 - Continue intermittent protonix q12hrs   Anxiety and depression - Hold Lexapro, Wellbutrin and trazodone for now -Will defer restarting home meds to psych   Best practice:  Diet:  Regular Pain/Anxiety/Delirium protocol (if indicated): N/A VAP protocol (if indicated): N/A DVT prophylaxis: Contraindicated GI prophylaxis: PPI Glucose control: N/A Central venous access:  d/c CVC 9/5 Arterial line:  N/A Foley: N/A Mobility:  Mobilize as able PT consulted: NO Last date of multidisciplinary goals of care discussion [9/5] Code Status:  full code Disposition: ICU   Critical care time: 40 minutes    Harlon Ditty, AGACNP-BC  Pulmonary & Critical Care Prefer epic messenger for cross cover needs If after hours, please call E-link

## 2021-09-20 DIAGNOSIS — T50901D Poisoning by unspecified drugs, medicaments and biological substances, accidental (unintentional), subsequent encounter: Secondary | ICD-10-CM

## 2021-09-20 DIAGNOSIS — G43909 Migraine, unspecified, not intractable, without status migrainosus: Secondary | ICD-10-CM

## 2021-09-20 DIAGNOSIS — F28 Other psychotic disorder not due to a substance or known physiological condition: Secondary | ICD-10-CM

## 2021-09-20 DIAGNOSIS — F419 Anxiety disorder, unspecified: Secondary | ICD-10-CM | POA: Diagnosis not present

## 2021-09-20 DIAGNOSIS — F1510 Other stimulant abuse, uncomplicated: Secondary | ICD-10-CM | POA: Diagnosis not present

## 2021-09-20 DIAGNOSIS — F988 Other specified behavioral and emotional disorders with onset usually occurring in childhood and adolescence: Secondary | ICD-10-CM | POA: Diagnosis not present

## 2021-09-20 DIAGNOSIS — T405X2A Poisoning by cocaine, intentional self-harm, initial encounter: Secondary | ICD-10-CM

## 2021-09-20 DIAGNOSIS — E039 Hypothyroidism, unspecified: Secondary | ICD-10-CM

## 2021-09-20 LAB — CBC
HCT: 29.1 % — ABNORMAL LOW (ref 36.0–46.0)
Hemoglobin: 8.8 g/dL — ABNORMAL LOW (ref 12.0–15.0)
MCH: 23.3 pg — ABNORMAL LOW (ref 26.0–34.0)
MCHC: 30.2 g/dL (ref 30.0–36.0)
MCV: 77 fL — ABNORMAL LOW (ref 80.0–100.0)
Platelets: 415 10*3/uL — ABNORMAL HIGH (ref 150–400)
RBC: 3.78 MIL/uL — ABNORMAL LOW (ref 3.87–5.11)
RDW: 17 % — ABNORMAL HIGH (ref 11.5–15.5)
WBC: 7.7 10*3/uL (ref 4.0–10.5)
nRBC: 0 % (ref 0.0–0.2)

## 2021-09-20 LAB — CULTURE, BLOOD (ROUTINE X 2): Culture: NO GROWTH

## 2021-09-20 LAB — BASIC METABOLIC PANEL
Anion gap: 4 — ABNORMAL LOW (ref 5–15)
BUN: 9 mg/dL (ref 6–20)
CO2: 25 mmol/L (ref 22–32)
Calcium: 8.2 mg/dL — ABNORMAL LOW (ref 8.9–10.3)
Chloride: 108 mmol/L (ref 98–111)
Creatinine, Ser: 0.4 mg/dL — ABNORMAL LOW (ref 0.44–1.00)
GFR, Estimated: >60 mL/min (ref >60)
Glucose, Bld: 85 mg/dL (ref 70–99)
Potassium: 3.5 mmol/L (ref 3.5–5.1)
Sodium: 137 mmol/L (ref 135–145)

## 2021-09-20 LAB — MAGNESIUM: Magnesium: 2 mg/dL (ref 1.7–2.4)

## 2021-09-20 LAB — PHOSPHORUS: Phosphorus: 2.6 mg/dL (ref 2.5–4.6)

## 2021-09-20 NOTE — Consult Note (Signed)
PHARMACY CONSULT NOTE  Pharmacy Consult for Electrolyte Monitoring and Replacement   Recent Labs: Potassium (mmol/L)  Date Value  09/20/2021 3.5   Magnesium (mg/dL)  Date Value  69/50/7225 2.0   Calcium (mg/dL)  Date Value  75/05/1831 8.2 (L)   Albumin (g/dL)  Date Value  58/25/1898 2.6 (L)   Phosphorus (mg/dL)  Date Value  42/10/3126 2.6   Sodium (mmol/L)  Date Value  09/20/2021 137   Corrected Ca: 9 mg/dL  Assessment: 36 y.o female with significant PMH of anxiety and depression, polysubstance abuse disorder, pyelonephritis, hypothyroidism, ADD, asthma, migraine headaches who presented to the ED on 8/31 with chief complaints of extreme agitation and possible drug withdrawals. Pt is intubated.   MIVF: on LR @100ml /h  Goal of Therapy:  WNL  Plan:  K: 3.3>3.5 - WNL today, no further repletion. Mg: 1.5>2.0 - WNL today, no further repletion. Recheck electrolytes in AM  , PharmD, [facility] Clinical Pharmacist 09/20/2021 8:25 AM

## 2021-09-20 NOTE — TOC Progression Note (Signed)
Transition of Care [facility]) - Progression Note    Patient Details  Name: Cassandra [name] MRN: [id number] Date of Birth: 06-25-1985  [facility]) CM/SW Contact  Allayne Butcher, RN Phone Number: 09/20/2021, 3:28 PM  Clinical Narrative:    Patient was seen and evaluated by psychiatry.  She did not meet criteria for inpatient admission.  She is back on her suboxone and lexapro.  If stable tomorrow MD will discharge home.     Expected Discharge Plan: Psychiatric Hospital Barriers to Discharge: Continued Medical Work up  Expected Discharge Plan and Services Expected Discharge Plan: Psychiatric Hospital       Living arrangements for the past 2 months: Single Family Home                                       Social Determinants of Health (SDOH) Interventions    Readmission Risk Interventions     No data to display

## 2021-09-20 NOTE — Progress Notes (Signed)
PROGRESS NOTE    Cassandra [name]   XBM:[id number] DOB: 04-23-1985  DOA: 09/14/2021 Date of Service: 09/20/21 PCP: Cassandra [name], Cassandra [name]     Brief Narrative / Hospital Course:  35 y.o female with significant PMH of anxiety and depression, polysubstance abuse disorder, pyelonephritis, hypothyroidism, ADD, asthma, migraine headaches who presented to the ED on 09/14/21 from home via EMS with chief complaints of extreme agitation and possible drug withdrawals. [name] ED notes, Cassandra [name] arrived from home via [facility] screaming and yelling incoherently.  Cassandra [name] reported to ED staff that she takes Buprenorphine and went to pick it up from the pharmacy but claimed she was dispensed the wrong medication.  Cassandra [name] reported that she thinks she is going through withdrawals.  She arrived with bottles of Lexapro and amphetamine salts which apparently is not prescribed to her.  Cassandra [name] admitted to taking methamphetamine and fentanyl the previous day 08/31-09/01: placed on IVC, continued agitation and violent/self-injurious behavior, not responding to Rx or physical restraints, she was placed on sedation/ventilation and admitted to ICU for drug induced psychosis and respiratory failure. UDS:+ Amphetamines, cocaine metabolites, and cannabinoid.  9/2: WUA performed pt remains severely agitated/delirious with precedex gtt will start phenobarbital taper and resume versed/fentanyl/propofol gtts  9/3: Pt remains mechanically intubated on minimal ventilator settings.  Sedated with precedex/fentanyl/propofol/versed gtts remains intermittently agitated/delirious.  Will attempt WUA once pts mother arrives at bedside ~ EXTUBATED 9/4: Calm on Precedex, wean as tolerated.  Respiratory status stable s/p extubation yesterday.  Psych consult pending 9/5: Starting Clonidine taper, weaning Precedex. D/c Phenobarbital. Complete 5 day course of Unasyn today.  Psychiatry consulted: Cassandra [name] does not meet criteria for psychiatric inpatient admission  at this time, recommended continue Suboxone, Lexapro 20 mg daily, trazodone 100 mg qhs 9/6: transfer out of ICU, if continuing to do well on clonidine tomorrow may be able to discharge home. IVC d/c.   Consultants:  Psychiatry   Procedures: 9/1: Intubation 9/1: Right IJ central line      ASSESSMENT & PLAN:   Principal Problem:   Drug overdose Active Problems:   Anxiety and depression   Hypothyroidism   Polysubstance dependence (HCC)   Amphetamine abuse (HCC)   Cocaine use   Poisoning by cocaine, intentional self-harm (HCC)   Drug-induced delirium   Psychosis (HCC)   Acute Hypoxic Respiratory Failure in the setting of Acute Drug Intoxication and possible Aspiration Event - RESOLVED (Extubated 09/17/21) Supplemental O2 as needed to maintain O2 sats >92% Completed Unasyn concern for aspiration   Toxic-Metabolic Encephalopathy Secondary to Drug Intoxication Drug Induced Psychosis - IMPROVING Hx: ADD, polysubstance abuse, opiate dependence on Subutex, Wellbutrin UDS + cocaine, amphetamine and cannabinoid.  Cassandra [name] admitted to taking fentanyl and methamphetamine as well Precedex d/c --> Clonidine taper now Psychiatry consulted IVC  Continue buprenorphine   Acute rhabdomyolysis Hypokalemia Hypomagnesema  Trend CK Monitor I&O's / urinary output Follow BMP Aggressive IVFs hydration Avoid nephrotoxic agents as able Replace electrolytes as indicated  Acute blood loss anemia: Coffee ground emesis [name] OGT~ RESOLVED, Cassandra further episodes Monitor for S/Sx of bleeding Trend CBC SCD's for VTE Prophylaxis (avoid chemical prophylaxis) Transfuse for Hgb <7  Continue intermittent protonix q12hrs    Hypothyroidism Check TSH, Free T4   Anxiety and depression Hold Lexapro, Wellbutrin and trazodone for now Psychiatry pending    DVT prophylaxis: SCD Pertinent IV fluids/nutrition: Regular diet Central lines / invasive devices: CVC triple-lumen right IJ  Code Status: Full  code Family Communication: none at this time   Disposition:  Plan to discharge home when medically stable TOC needs: none at this time Barriers to discharge / significant pending items: medical stability, off clonidine taper, anticipate discharge and another 1 to 2 days            Subjective:  Cassandra [name] reports feeling okay today, tired but Cassandra other complaints.        Objective:  Vitals:   09/19/21 2000 09/20/21 0000 09/20/21 0316 09/20/21 0400  BP: 105/71 (!) 92/52  97/68  Pulse: 61 65  65  Resp: 16 17  12   Temp: 98 F (36.7 C) 97.9 F (36.6 C)  98 F (36.7 C)  TempSrc: Oral Oral  Oral  SpO2: 98% 99% 97% 96%  Weight:      Height:        Intake/Output Summary (Last 24 hours) at 09/20/2021 0805 Last data filed at 09/19/2021 1800 Gross [name] 24 hour  Intake 1758.09 ml  Output 1275 ml  Net 483.09 ml   Filed Weights   09/14/21 2139 09/16/21 0425 09/17/21 0428  Weight: 48.5 kg 41.9 kg 36.1 kg    Examination:  Constitutional:  VS as above General Appearance: alert but drowsy, thin, NAD Eyes: Normal lids and conjunctive, non-icteric sclera Ears, Nose, Mouth, Throat: Normal external appearance MMM Neck: Cassandra masses, trachea midline Respiratory: Normal respiratory effort Cassandra wheeze Cassandra rhonchi Cassandra rales Cardiovascular: S1/S2 normal Cassandra murmur Cassandra rub/gallop auscultated Cassandra JVD Cassandra lower extremity edema Gastrointestinal: Cassandra tenderness Cassandra masses Cassandra hernia appreciated Musculoskeletal:  Cassandra clubbing/cyanosis of digits Symmetrical movement in all extremities Neurological: Cassandra cranial nerve deficit on limited exam Alert Psychiatric: Normal judgment/insight Normal mood and affect       Scheduled Medications:   buprenorphine  4 mg Sublingual QPM   buprenorphine  8 mg Sublingual Daily   Chlorhexidine Gluconate Cloth  6 each Topical Daily   cloNIDine  0.3 mg Oral TID   Followed by   11/17/21 ON 09/21/2021] cloNIDine  0.3 mg Oral BID   Followed by   11/21/2021  ON 09/23/2021] cloNIDine  0.3 mg Oral Daily   escitalopram  20 mg Oral Daily   feeding supplement  237 mL Oral TID BM   gabapentin  400 mg Oral BID   multivitamin with minerals  1 tablet Oral Daily   mupirocin ointment  1 Application Nasal BID   pantoprazole  40 mg Intravenous Q12H   traZODone  100 mg Oral QHS    Continuous Infusions:  dexmedetomidine (PRECEDEX) IV infusion Stopped (09/20/21 0201)   lactated ringers 100 mL/hr at 09/19/21 2312    PRN Medications:  docusate sodium, LORazepam, mouth rinse, polyethylene glycol  Antimicrobials:  Anti-infectives (From admission, onward)    Start     Dose/Rate Route Frequency Ordered Stop   09/15/21 0500  Ampicillin-Sulbactam (UNASYN) 3 g in sodium chloride 0.9 % 100 mL IVPB  Status:  Discontinued        3 g 200 mL/hr over 30 Minutes Intravenous Every 6 hours 09/15/21 0455 09/19/21 1017       Data Reviewed: I have personally reviewed following labs and imaging studies  CBC: Recent Labs  Lab 09/16/21 0419 09/16/21 0940 09/17/21 0307 09/17/21 1035 09/18/21 0248 09/19/21 0550 09/20/21 0520  WBC 8.3  --  8.1  --  7.2 6.0 7.7  HGB 7.8*   < > 8.3* 9.5* 9.0* 8.6* 8.8*  HCT 25.4*   < > 27.4* 30.3* 29.3* 27.5* 29.1*  MCV 76.7*  --  77.2*  --  75.5*  75.8* 77.0*  PLT 397  --  426*  --  411* 381 415*   < > = values in this interval not displayed.   Basic Metabolic Panel: Recent Labs  Lab 09/16/21 0419 09/16/21 1131 09/16/21 1257 09/17/21 0307 09/18/21 0248 09/19/21 0550 09/20/21 0520  NA 141  --   --  135 134* 140 137  K 3.2*   < > 3.9 3.4* 3.6 3.3* 3.5  CL 116*  --   --  111 107 109 108  CO2 22  --   --  16* 22 23 25   GLUCOSE 84  --   --  72 133* 118* 85  BUN 8  --   --  6 <5* <5* 9  CREATININE 0.43*  --   --  0.41* 0.46 0.48 0.40*  CALCIUM 6.8*  --   --  7.0* 8.0* 8.1* 8.2*  MG 2.1  --   --  1.7 1.8 1.5* 2.0  PHOS 2.1*   < > 2.7 2.6 2.9 2.7 2.6   < > = values in this interval not displayed.   GFR: Estimated  Creatinine Clearance: 55.4 mL/min (A) (by C-G formula based on SCr of 0.4 mg/dL (L)). Liver Function Tests: Recent Labs  Lab 09/14/21 2150 09/15/21 0457 09/16/21 0419  AST 36 46* 29  ALT 17 21 15   ALKPHOS 70 53 49  BILITOT 0.9 0.7 0.6  PROT 8.4* 6.6 5.0*  ALBUMIN 4.3 3.5 2.6*   Cassandra results for input(s): "LIPASE", "AMYLASE" in the last 168 hours. Cassandra results for input(s): "AMMONIA" in the last 168 hours. Coagulation Profile: Recent Labs  Lab 09/15/21 0457  INR 1.1   Cardiac Enzymes: Recent Labs  Lab 09/15/21 0457 09/16/21 0419 09/17/21 1035 09/18/21 0247 09/19/21 0550  CKTOTAL 1,903* 672* 3,495* 2,913* 1,375*   BNP (last 3 results) Cassandra results for input(s): "PROBNP" in the last 8760 hours. HbA1C: Cassandra results for input(s): "HGBA1C" in the last 72 hours. CBG: Recent Labs  Lab 09/18/21 0717 09/18/21 1125 09/18/21 1516 09/18/21 1941 09/18/21 2341  GLUCAP 120* 139* 144* 144* 120*   Lipid Profile: Cassandra results for input(s): "CHOL", "HDL", "LDLCALC", "TRIG", "CHOLHDL", "LDLDIRECT" in the last 72 hours. Thyroid Function Tests: Cassandra results for input(s): "TSH", "T4TOTAL", "FREET4", "T3FREE", "THYROIDAB" in the last 72 hours. Anemia Panel: Cassandra results for input(s): "VITAMINB12", "FOLATE", "FERRITIN", "TIBC", "IRON", "RETICCTPCT" in the last 72 hours. Urine analysis:    Component Value Date/Time   COLORURINE AMBER (A) 09/15/2021 0015   APPEARANCEUR CLOUDY (A) 09/15/2021 0015   LABSPEC 1.021 09/15/2021 0015   PHURINE 7.0 09/15/2021 0015   GLUCOSEU NEGATIVE 09/15/2021 0015   HGBUR NEGATIVE 09/15/2021 0015   BILIRUBINUR NEGATIVE 09/15/2021 0015   KETONESUR NEGATIVE 09/15/2021 0015   PROTEINUR 30 (A) 09/15/2021 0015   NITRITE POSITIVE (A) 09/15/2021 0015   LEUKOCYTESUR TRACE (A) 09/15/2021 0015   Sepsis Labs: @LABRCNTIP (procalcitonin:4,lacticidven:4)  Recent Results (from the past 240 hour(s))  MRSA Next Gen by PCR, Nasal     Status: Abnormal   Collection Time: 09/15/21  12:04 PM   Specimen: Nasal Mucosa; Nasal Swab  Result Value Ref Range Status   MRSA by PCR Next Gen DETECTED (A) NOT DETECTED Final    Comment: RESULT CALLED TO, READ BACK BY AND VERIFIED WITH: SHELLEY FIELDS, RN 1319 09/15/21 GM (NOTE) The GeneXpert MRSA Assay (FDA approved for NASAL specimens only), is one component of a comprehensive MRSA colonization surveillance program. It is not intended to diagnose MRSA infection nor to  guide or monitor treatment for MRSA infections. Test performance is not FDA approved in patients less than 36 years old. Performed at [facility], [address] [place], [place] [address]   Culture, blood (Routine X 2) w Reflex to ID Panel     Status: None   Collection Time: 09/15/21  3:17 PM   Specimen: BLOOD  Result Value Ref Range Status   Specimen Description BLOOD BLOOD RIGHT HAND  Final   Special Requests   Final    BOTTLES DRAWN AEROBIC ONLY Blood Culture results may not be optimal due to an inadequate volume of blood received in culture bottles   Culture   Final    Cassandra GROWTH 5 DAYS Performed at [facility], [address] [place], [place] [address]    Report Status 09/20/2021 FINAL  Final  Culture, blood (Routine X 2) w Reflex to ID Panel     Status: None (Preliminary result)   Collection Time: 09/16/21 12:22 AM   Specimen: BLOOD  Result Value Ref Range Status   Specimen Description BLOOD RIGHT HAND  Final   Special Requests IN PEDIATRIC BOTTLE Blood Culture adequate volume  Final   Culture   Final    Cassandra GROWTH 4 DAYS Performed at [facility], [address] [place], [place] [address]    Report Status PENDING  Incomplete         Radiology Studies: DG Finger Thumb Right  Result Date: 09/15/2021 CLINICAL DATA:  Right thumb fracture. EXAM: RIGHT THUMB 2+V COMPARISON:  None Available. FINDINGS: There is Cassandra evidence of fracture or dislocation. There is Cassandra evidence of arthropathy or other focal bone  abnormality. Soft tissues are unremarkable. IMPRESSION: Negative. Electronically Signed   By: Lupita Raider M.D.   On: 09/15/2021 17:48   DG Abd 1 View  Result Date: 09/15/2021 CLINICAL DATA:  36 year old female intubated. Possible overdose. EXAM: ABDOMEN - 1 VIEW COMPARISON:  Portable chest x-ray 0432 hours today. FINDINGS: Portable AP supine view at 0450 hours. Gas-filled but nondilated small and large bowel throughout the abdomen and pelvis. Enteric tube in the left upper quadrant with side hole at the level of the gastric body. Cassandra gastric distention is evident. Cassandra acute osseous abnormality identified. Pelvic catheter partially visible in the midline, likely Foley. IMPRESSION: 1. Gas-filled but nondilated small and large bowel loops may indicate ileus. 2. Satisfactory enteric tube placement. Probable Foley catheter in the midline pelvis. Electronically Signed   By: Odessa Fleming M.D.   On: 09/15/2021 04:58   DG Chest Port 1 View  Result Date: 09/15/2021 CLINICAL DATA:  36 year old female intubated.  Possible overdose. EXAM: PORTABLE CHEST 1 VIEW COMPARISON:  None Available. FINDINGS: Portable AP supine view at 0432 hours. Satisfactory endotracheal tube tip between the clavicles and carina. Enteric tube placed into the stomach, side hole the level of the gastric body. Right IJ approach central line placed, tip is below the superior cavoatrial junction, 1-2 cm into the right atrium. Lung volumes and mediastinal contours are within normal limits. Cassandra pneumothorax. Allowing for portable technique the lungs are clear. Paucity of bowel gas in the upper abdomen. Cassandra osseous abnormality identified. IMPRESSION: 1. Right IJ approach central line placed, tip likely within the right atrium. Recommend retraction of 1 cm for cavoatrial junction level placement. 2. Satisfactory endotracheal and enteric tubes. 3. Cassandra acute cardiopulmonary abnormality. Electronically Signed   By: Odessa Fleming M.D.   On: 09/15/2021 04:57   CT Head Wo  Contrast  Result Date: 09/14/2021 CLINICAL DATA:  Facial trauma. Possible overdose. Small injury under bridge of the nose. EXAM: CT HEAD WITHOUT CONTRAST TECHNIQUE: Contiguous axial images were obtained from the base of the skull through the vertex without intravenous contrast. RADIATION DOSE REDUCTION: This exam was performed according to the departmental dose-optimization program which includes automated exposure control, adjustment of the mA and/or kV according to Cassandra [name] size and/or use of iterative reconstruction technique. COMPARISON:  None Available. FINDINGS: Brain: Cassandra evidence of acute infarction, hemorrhage, hydrocephalus, extra-axial collection or mass lesion/mass effect. Vascular: Cassandra hyperdense vessel or unexpected calcification. Skull: Normal. Negative for fracture or focal lesion. Sinuses/Orbits: Cassandra acute finding. Other: None. IMPRESSION: Cassandra acute intracranial abnormality. Electronically Signed   By: Larose HiresImran  Ahmed D.O.   On: 09/14/2021 22:30            LOS: 5 days       Sunnie NielsenNatalie Chancy Smigiel, DO [facility] 09/20/2021, 8:05 AM   Staff may message me via secure chat in Epic  but this may not receive immediate response,  please page for urgent matters!  If 7PM-7AM, please contact night-coverage [email or web address]  Dictation software was used to generate the above note. Typos may occur and escape review, as with typed/written notes. Please contact Dr Lyn HollingsheadAlexander directly for clarity if needed.

## 2021-09-20 NOTE — Hospital Course (Addendum)
36 y.o female with significant PMH of anxiety and depression, polysubstance abuse disorder, pyelonephritis, hypothyroidism, ADD, asthma, migraine headaches who presented to the ED on 09/14/21 from home via EMS with chief complaints of extreme agitation and possible drug withdrawals. Per ED notes, patient arrived from home via [facility] EMS screaming and yelling incoherently.  Patient reported to ED staff that she takes Buprenorphine and went to pick it up from the pharmacy but claimed she was dispensed the wrong medication.  Patient reported that she thinks she is going through withdrawals.  She arrived with bottles of Lexapro and amphetamine salts which apparently is not prescribed to her.  Patient admitted to taking methamphetamine and fentanyl the previous day 08/31-09/01: placed on IVC, continued agitation and violent/self-injurious behavior, not responding to Rx or physical restraints, she was placed on sedation/ventilation and admitted to ICU for drug induced psychosis and respiratory failure. UDS:+ Amphetamines, cocaine metabolites, and cannabinoid.  9/2: WUA performed pt remains severely agitated/delirious with precedex gtt will start phenobarbital taper and resume versed/fentanyl/propofol gtts  9/3: Pt remains mechanically intubated on minimal ventilator settings.  Sedated with precedex/fentanyl/propofol/versed gtts remains intermittently agitated/delirious.  Will attempt WUA once pts mother arrives at bedside ~ EXTUBATED 9/4: Calm on Precedex, wean as tolerated.  Respiratory status stable s/p extubation yesterday.  Psych consult pending 9/5: Starting Clonidine taper, weaning Precedex. D/c Phenobarbital. Complete 5 day course of Unasyn today 9/6:off precedex and doing well, BP lower on clonidine so reduced dose 9/7: BP still on the low side, but improved throughout the day, pt stable for discharge.  Discussed importance of medication compliance and abstinence from recreational drugs.  Consultants:   Psychiatry   Procedures: 9/1: Intubation 9/1: Right IJ central line      ASSESSMENT & PLAN:   Principal Problem:   Drug overdose Active Problems:   Anxiety and depression   Hypothyroidism   Polysubstance dependence (HCC)   Amphetamine abuse (HCC)   Cocaine use   Poisoning by cocaine, intentional self-harm (HCC)   Drug-induced delirium   Psychosis (HCC)   Acute Hypoxic Respiratory Failure in the setting of Acute Drug Intoxication and possible Aspiration Event - RESOLVED (Extubated 09/17/21) Supplemental O2 as needed to maintain O2 sats >92% Completed Unasyn concern for aspiration   Toxic-Metabolic Encephalopathy Secondary to Drug Intoxication Drug Induced Psychosis - IMPROVING Hx: ADD, polysubstance abuse, opiate dependence on Subutex, Wellbutrin UDS + cocaine, amphetamine and cannabinoid.  Patient admitted to taking fentanyl and methamphetamine as well Precedex d/c --> Clonidine taper now --> finish clonidine outpatient Psychiatry consulted IVC was able to be discontinued Continue buprenorphine with outpatient provider   Acute rhabdomyolysis Hypokalemia Hypomagnesema  Trend CK showed improvement Monitor I&O's / urinary output Follow BMP Aggressive IVFs hydration Avoid nephrotoxic agents as able Replace electrolytes as indicated  Acute blood loss anemia: Coffee ground emesis per OGT~ RESOLVED, no further episodes Monitor for S/Sx of bleeding -no further issues Trend CBC SCD's for VTE Prophylaxis (avoid chemical prophylaxis) Transfuse for Hgb <7  Continued Protonix on discharge   Hypothyroidism Check TSH, Free T4 were normal   Anxiety and depression Restart Lexapro, Wellbutrin on discharge Follow-up with PCP for Adderall

## 2021-09-21 DIAGNOSIS — F29 Unspecified psychosis not due to a substance or known physiological condition: Secondary | ICD-10-CM

## 2021-09-21 DIAGNOSIS — F988 Other specified behavioral and emotional disorders with onset usually occurring in childhood and adolescence: Secondary | ICD-10-CM | POA: Diagnosis not present

## 2021-09-21 DIAGNOSIS — T50901D Poisoning by unspecified drugs, medicaments and biological substances, accidental (unintentional), subsequent encounter: Secondary | ICD-10-CM | POA: Diagnosis not present

## 2021-09-21 DIAGNOSIS — F1510 Other stimulant abuse, uncomplicated: Secondary | ICD-10-CM | POA: Diagnosis not present

## 2021-09-21 LAB — CULTURE, BLOOD (ROUTINE X 2)
Culture: NO GROWTH
Special Requests: ADEQUATE

## 2021-09-21 LAB — PHOSPHORUS: Phosphorus: 3.5 mg/dL (ref 2.5–4.6)

## 2021-09-21 LAB — CBC
HCT: 30.1 % — ABNORMAL LOW (ref 36.0–46.0)
Hemoglobin: 9.1 g/dL — ABNORMAL LOW (ref 12.0–15.0)
MCH: 23.4 pg — ABNORMAL LOW (ref 26.0–34.0)
MCHC: 30.2 g/dL (ref 30.0–36.0)
MCV: 77.4 fL — ABNORMAL LOW (ref 80.0–100.0)
Platelets: 426 10*3/uL — ABNORMAL HIGH (ref 150–400)
RBC: 3.89 MIL/uL (ref 3.87–5.11)
RDW: 17.5 % — ABNORMAL HIGH (ref 11.5–15.5)
WBC: 7.4 10*3/uL (ref 4.0–10.5)
nRBC: 0 % (ref 0.0–0.2)

## 2021-09-21 LAB — BASIC METABOLIC PANEL
Anion gap: 6 (ref 5–15)
BUN: 8 mg/dL (ref 6–20)
CO2: 26 mmol/L (ref 22–32)
Calcium: 8.6 mg/dL — ABNORMAL LOW (ref 8.9–10.3)
Chloride: 107 mmol/L (ref 98–111)
Creatinine, Ser: 0.44 mg/dL (ref 0.44–1.00)
GFR, Estimated: >60 mL/min (ref >60)
Glucose, Bld: 114 mg/dL — ABNORMAL HIGH (ref 70–99)
Potassium: 3.5 mmol/L (ref 3.5–5.1)
Sodium: 139 mmol/L (ref 135–145)

## 2021-09-21 LAB — MAGNESIUM: Magnesium: 1.8 mg/dL (ref 1.7–2.4)

## 2021-09-21 MED ORDER — CLONIDINE HCL 0.2 MG PO TABS: 0.2000 mg | ORAL_TABLET | Freq: Every day | ORAL | 0 refills | Status: DC

## 2021-09-21 MED ORDER — PANTOPRAZOLE SODIUM 40 MG PO TBEC: 40.0000 mg | DELAYED_RELEASE_TABLET | Freq: Two times a day (BID) | ORAL | Status: DC

## 2021-09-21 MED ORDER — ADULT MULTIVITAMIN W/MINERALS CH: 1.0000 | ORAL_TABLET | Freq: Every day | ORAL | Status: DC

## 2021-09-21 MED ORDER — GABAPENTIN 300 MG PO CAPS: 300.0000 mg | ORAL_CAPSULE | Freq: Two times a day (BID) | ORAL | 0 refills | Status: DC

## 2021-09-21 MED ORDER — CLONIDINE HCL 0.1 MG PO TABS: 0.2000 mg | ORAL_TABLET | Freq: Every day | ORAL | Status: DC

## 2021-09-21 MED ORDER — ESCITALOPRAM OXALATE 20 MG PO TABS: 20.0000 mg | ORAL_TABLET | Freq: Every day | ORAL | 0 refills | Status: DC

## 2021-09-21 MED ORDER — PANTOPRAZOLE SODIUM 40 MG PO TBEC: 40.0000 mg | DELAYED_RELEASE_TABLET | Freq: Every day | ORAL | 0 refills | Status: DC

## 2021-09-21 MED ORDER — MELOXICAM 15 MG PO TABS: ORAL_TABLET | ORAL | 0 refills | Status: DC

## 2021-09-21 MED ORDER — CLONIDINE HCL 0.1 MG PO TABS
0.2000 mg | ORAL_TABLET | Freq: Two times a day (BID) | ORAL | Status: DC
  Administered 2021-09-21: 0.2 mg via ORAL
  Filled 2021-09-21: qty 2

## 2021-09-21 MED ORDER — BUPROPION HCL ER (SR) 150 MG PO TB12: 150.0000 mg | ORAL_TABLET | Freq: Every morning | ORAL | 0 refills | Status: DC

## 2021-09-21 MED ORDER — TRAZODONE HCL 100 MG PO TABS: 200.0000 mg | ORAL_TABLET | Freq: Every evening | ORAL | 0 refills | Status: DC | PRN

## 2021-09-21 NOTE — Hospital Course (Signed)
36 y.o female with significant PMH of anxiety and depression, polysubstance abuse disorder, pyelonephritis, hypothyroidism, ADD, asthma, migraine headaches who presented to the ED on 09/14/21 from home via EMS with chief complaints of extreme agitation and possible drug withdrawals. Per ED notes, patient arrived from home via [facility] EMS screaming and yelling incoherently.  Patient reported to ED staff that she takes Buprenorphine and went to pick it up from the pharmacy but claimed she was dispensed the wrong medication.  Patient reported that she thinks she is going through withdrawals.  She arrived with bottles of Lexapro and amphetamine salts which apparently is not prescribed to her.  Patient admitted to taking methamphetamine and fentanyl the previous day 08/31-09/01: placed on IVC, continued agitation and violent/self-injurious behavior, not responding to Rx or physical restraints, she was placed on sedation/ventilation and admitted to ICU for drug induced psychosis and respiratory failure. UDS:+ Amphetamines, cocaine metabolites, and cannabinoid.  9/2: WUA performed pt remains severely agitated/delirious with precedex gtt will start phenobarbital taper and resume versed/fentanyl/propofol gtts  9/3: Pt remains mechanically intubated on minimal ventilator settings.  Sedated with precedex/fentanyl/propofol/versed gtts remains intermittently agitated/delirious.  Will attempt WUA once pts mother arrives at bedside ~ EXTUBATED 9/4: Calm on Precedex, wean as tolerated.  Respiratory status stable s/p extubation yesterday.  Psych consult pending 9/5: Starting Clonidine taper, weaning Precedex. D/c Phenobarbital. Complete 5 day course of Unasyn today.  Psychiatry consulted: Patient does not meet criteria for psychiatric inpatient admission at this time, recommended continue Suboxone, Lexapro 20 mg daily, trazodone 100 mg qhs 9/6: transfer out of ICU, if continuing to do well on clonidine tomorrow may be able to  discharge home. IVC d/c.  9/7: labs WNL. Mild hypotension, adjusted clonidine taper    Consultants:  Psychiatry    Procedures: 9/1: Intubation 9/1: Right IJ central line           ASSESSMENT & PLAN:   Principal Problem:   Drug overdose Active Problems:   Anxiety and depression   Hypothyroidism   Polysubstance dependence (HCC)   Amphetamine abuse (HCC)   Cocaine use   Poisoning by cocaine, intentional self-harm (HCC)   Drug-induced delirium   Psychosis (HCC)     Acute Hypoxic Respiratory Failure in the setting of Acute Drug Intoxication and possible Aspiration Event - RESOLVED (Extubated 09/17/21) Supplemental O2 as needed to maintain O2 sats >92% Completed Unasyn concern for aspiration   Toxic-Metabolic Encephalopathy Secondary to Drug Intoxication Drug Induced Psychosis - IMPROVING Hx: ADD, polysubstance abuse, opiate dependence on Subutex, Wellbutrin UDS + cocaine, amphetamine and cannabinoid.  Patient admitted to taking fentanyl and methamphetamine as well Precedex d/c --> Clonidine taper now Psychiatry consulted IVC  Continue buprenorphine   Acute rhabdomyolysis Hypokalemia Hypomagnesema  Trend CK Monitor I&O's / urinary output Follow BMP Aggressive IVFs hydration Avoid nephrotoxic agents as able Replace electrolytes as indicated   Acute blood loss anemia: Coffee ground emesis per OGT~ RESOLVED, no further episodes Monitor for S/Sx of bleeding Trend CBC SCD's for VTE Prophylaxis (avoid chemical prophylaxis) Transfuse for Hgb <7  Continue intermittent protonix q12hrs    Hypothyroidism Check TSH, Free T4   Anxiety and depression Hold Lexapro, Wellbutrin and trazodone for now Psychiatry pending      DVT prophylaxis: SCD Pertinent IV fluids/nutrition: Regular diet Central lines / invasive devices: CVC triple-lumen right IJ   Code Status: Full code Family Communication: none at this time    Disposition: Plan to discharge home when medically  stable TOC needs: none at  this time Barriers to discharge / significant pending items: medical stability, off clonidine taper, anticipate discharge and another 1 to 2 days

## 2021-09-21 NOTE — Discharge Summary (Signed)
Physician Discharge Summary   Patient: Cassandra [name] MRN: [id number]  DOB: 02-14-1985   Admit:     Date of Admission: 09/14/2021 Admitted from: home   Discharge: Date of discharge: 09/21/21 Disposition: Home Condition at discharge: good  CODE STATUS: FULL     Discharge Physician: Emeterio Reeve, DO [facility]     PCP: Patient, No Pcp Per  Recommendations for Outpatient Follow-up:  Follow up with PCP Patient, No Pcp Per in 1-2 weeks Please obtain labs/tests: as needed, monitor BP and medical compliance / drug abstinence  Please follow up on the following pending results: none PCP AND OTHER OUTPATIENT PROVIDERS: SEE BELOW FOR SPECIFIC DISCHARGE INSTRUCTIONS PRINTED FOR PATIENT IN ADDITION TO GENERIC AVS PATIENT INFO  Discharge Instructions     Diet - low sodium heart healthy   Complete by: As directed    Increase activity slowly   Complete by: As directed          Discharge Diagnoses: Principal Problem:   Drug overdose Active Problems:   Anxiety and depression   Hypothyroidism   Polysubstance dependence ([place])   Amphetamine abuse ([place])   Cocaine use   Poisoning by cocaine, intentional self-harm ([place])   Drug-induced delirium   Psychosis [facility])       Hospital Course: 36 y.o female with significant PMH of anxiety and depression, polysubstance abuse disorder, pyelonephritis, hypothyroidism, ADD, asthma, migraine headaches who presented to the ED on 09/14/21 from home via EMS with chief complaints of extreme agitation and possible drug withdrawals. Per ED notes, patient arrived from home via [facility] screaming and yelling incoherently.  Patient reported to ED staff that she takes Buprenorphine and went to pick it up from the pharmacy but claimed she was dispensed the wrong medication.  Patient reported that she thinks she is going through withdrawals.  She arrived with bottles of Lexapro and amphetamine salts which apparently is not prescribed to her.   Patient admitted to taking methamphetamine and fentanyl the previous day 08/31-09/01: placed on IVC, continued agitation and violent/self-injurious behavior, not responding to Rx or physical restraints, she was placed on sedation/ventilation and admitted to ICU for drug induced psychosis and respiratory failure. UDS:+ Amphetamines, cocaine metabolites, and cannabinoid.  9/2: WUA performed pt remains severely agitated/delirious with precedex gtt will start phenobarbital taper and resume versed/fentanyl/propofol gtts  9/3: Pt remains mechanically intubated on minimal ventilator settings.  Sedated with precedex/fentanyl/propofol/versed gtts remains intermittently agitated/delirious.  Will attempt WUA once pts mother arrives at bedside ~ EXTUBATED 9/4: Calm on Precedex, wean as tolerated.  Respiratory status stable s/p extubation yesterday.  Psych consult pending 9/5: Starting Clonidine taper, weaning Precedex. D/c Phenobarbital. Complete 5 day course of Unasyn today 9/6:off precedex and doing well, BP lower on clonidine so reduced dose 9/7: BP still on the low side, but improved throughout the day, pt stable for discharge.  Discussed importance of medication compliance and abstinence from recreational drugs.  Consultants:  Psychiatry   Procedures: 9/1: Intubation 9/1: Right IJ central line      ASSESSMENT & PLAN:   Principal Problem:   Drug overdose Active Problems:   Anxiety and depression   Hypothyroidism   Polysubstance dependence ([place])   Amphetamine abuse ([place])   Cocaine use   Poisoning by cocaine, intentional self-harm ([place])   Drug-induced delirium   Psychosis ([place])   Acute Hypoxic Respiratory Failure in the setting of Acute Drug Intoxication and possible Aspiration Event - RESOLVED (Extubated 09/17/21) Supplemental O2 as needed to  maintain O2 sats >92% Completed Unasyn concern for aspiration   Toxic-Metabolic Encephalopathy Secondary to Drug Intoxication Drug Induced  Psychosis - IMPROVING Hx: ADD, polysubstance abuse, opiate dependence on Subutex, Wellbutrin UDS + cocaine, amphetamine and cannabinoid.  Patient admitted to taking fentanyl and methamphetamine as well Precedex d/c --> Clonidine taper now --> finish clonidine outpatient Psychiatry consulted IVC was able to be discontinued Continue buprenorphine with outpatient provider   Acute rhabdomyolysis Hypokalemia Hypomagnesema  Trend CK showed improvement Monitor I&O's / urinary output Follow BMP Aggressive IVFs hydration Avoid nephrotoxic agents as able Replace electrolytes as indicated  Acute blood loss anemia: Coffee ground emesis per OGT~ RESOLVED, no further episodes Monitor for S/Sx of bleeding -no further issues Trend CBC SCD's for VTE Prophylaxis (avoid chemical prophylaxis) Transfuse for Hgb <7  Continued Protonix on discharge   Hypothyroidism Check TSH, Free T4 were normal   Anxiety and depression Restart Lexapro, Wellbutrin on discharge Follow-up with PCP for Adderall            Discharge Instructions  Allergies as of 09/21/2021       Reactions   Metronidazole Nausea And Vomiting   Prednisone Other (See Comments)   irritable/ angry and very lethargic when finishes        Medication List     STOP taking these medications    ibuprofen 600 MG tablet Commonly known as: ADVIL   methylergonovine 0.2 MG tablet Commonly known as: METHERGINE   nitrofurantoin (macrocrystal-monohydrate) 100 MG capsule Commonly known as: MACROBID   ondansetron 4 MG disintegrating tablet Commonly known as: ZOFRAN-ODT   promethazine 25 MG suppository Commonly known as: PHENERGAN       TAKE these medications    Adderall XR 30 MG 24 hr capsule Generic drug: amphetamine-dextroamphetamine Take 30 mg by mouth 2 (two) times daily.   baclofen 10 MG tablet Commonly known as: LIORESAL Take 10 mg by mouth 3 (three) times daily as needed.   buprenorphine 8 MG Subl SL  tablet Commonly known as: SUBUTEX Place 8 mg under the tongue 2 (two) times daily.   buPROPion 150 MG 12 hr tablet Commonly known as: WELLBUTRIN SR Take 1 tablet (150 mg total) by mouth every morning.   cloNIDine 0.2 MG tablet Commonly known as: CATAPRES Take 1 tablet (0.2 mg total) by mouth daily for 2 days. Start taking on: September 22, 2021   escitalopram 20 MG tablet Commonly known as: LEXAPRO Take 1 tablet (20 mg total) by mouth daily.   etonogestrel-ethinyl estradiol 0.12-0.015 MG/24HR vaginal ring Commonly known as: NUVARING Insert vaginally and leave in place for 3 consecutive weeks, then remove for 1 week.   gabapentin 300 MG capsule Commonly known as: NEURONTIN Take 1 capsule (300 mg total) by mouth 2 (two) times daily. What changed:  medication strength how much to take   meloxicam 15 MG tablet Commonly known as: MOBIC One tab PO qAM with breakfast for 2 weeks, then daily prn pain.   multivitamin with minerals Tabs tablet Take 1 tablet by mouth daily.   naloxone 4 MG/0.1ML Liqd nasal spray kit Commonly known as: NARCAN 1 spray once.   nicotine polacrilex 2 MG gum Commonly known as: NICORETTE SMARTSIG:1 Each By Mouth Every 2 Hours PRN   pantoprazole 40 MG tablet Commonly known as: PROTONIX Take 1 tablet (40 mg total) by mouth daily.   traZODone 100 MG tablet Commonly known as: DESYREL Take 2 tablets (200 mg total) by mouth at bedtime as needed.  Follow-up Information     Pavelock, Ralene Bathe, MD. Call.   Specialty: Internal Medicine Why: For hospital follow up appointment Contact information: [address] [place] [address] [phone]                 Allergies  Allergen Reactions   Metronidazole Nausea And Vomiting   Prednisone Other (See Comments)    irritable/ angry and very lethargic when finishes     Subjective: Patient feels well this morning, examined again later in the afternoon and no  concerns, blood pressure has remained stable throughout today.  She is upset that her family got rid of most of her 39 cats, but otherwise she is in good spirits and excited to get out of the hospital.   Discharge Exam: BP (!) 96/57 (BP Location: Left Arm)   Pulse 77   Temp 98.2 F (36.8 C) (Oral)   Resp 16   Ht 5' 2"  (1.575 m)   Wt 36.1 kg   SpO2 99%   BMI 14.56 kg/m  General: Pt is alert, awake, not in acute distress Cardiovascular: RRR, S1/S2 +, no rubs, no gallops Respiratory: CTA bilaterally, no wheezing, no rhonchi Abdominal: Soft, NT, ND, bowel sounds + Extremities: no edema, no cyanosis     The results of significant diagnostics from this hospitalization (including imaging, microbiology, ancillary and laboratory) are listed below for reference.     Microbiology: Recent Results (from the past 240 hour(s))  MRSA Next Gen by PCR, Nasal     Status: Abnormal   Collection Time: 09/15/21 12:04 PM   Specimen: Nasal Mucosa; Nasal Swab  Result Value Ref Range Status   MRSA by PCR Next Gen DETECTED (A) NOT DETECTED Final    Comment: RESULT CALLED TO, READ BACK BY AND VERIFIED WITH: SHELLEY FIELDS, RN 1319 09/15/21 GM (NOTE) The GeneXpert MRSA Assay (FDA approved for NASAL specimens only), is one component of a comprehensive MRSA colonization surveillance program. It is not intended to diagnose MRSA infection nor to guide or monitor treatment for MRSA infections. Test performance is not FDA approved in patients less than 37 years old. Performed at [facility], [address] [place], [place] [address]   Culture, blood (Routine X 2) w Reflex to ID Panel     Status: None   Collection Time: 09/15/21  3:17 PM   Specimen: BLOOD  Result Value Ref Range Status   Specimen Description BLOOD BLOOD RIGHT HAND  Final   Special Requests   Final    BOTTLES DRAWN AEROBIC ONLY Blood Culture results may not be optimal due to an inadequate volume of blood received in culture  bottles   Culture   Final    NO GROWTH 5 DAYS Performed at [facility], [address] [place], [place] [address]    Report Status 09/20/2021 FINAL  Final  Culture, blood (Routine X 2) w Reflex to ID Panel     Status: None   Collection Time: 09/16/21 12:22 AM   Specimen: BLOOD  Result Value Ref Range Status   Specimen Description BLOOD RIGHT HAND  Final   Special Requests IN PEDIATRIC BOTTLE Blood Culture adequate volume  Final   Culture   Final    NO GROWTH 5 DAYS Performed at [facility], [address], [place], [place] [address]    Report Status 09/21/2021 FINAL  Final     Labs: BNP (last 3 results) No results for input(s): "BNP" in the last  8760 hours. Basic Metabolic Panel: Recent Labs  Lab 09/17/21 0307 09/18/21 0248 09/19/21 0550 09/20/21 0520 09/21/21 0520  NA 135 134* 140 137 139  K 3.4* 3.6 3.3* 3.5 3.5  CL 111 107 109 108 107  CO2 16* 22 23 25 26   GLUCOSE 72 133* 118* 85 114*  BUN 6 <5* <5* 9 8  CREATININE 0.41* 0.46 0.48 0.40* 0.44  CALCIUM 7.0* 8.0* 8.1* 8.2* 8.6*  MG 1.7 1.8 1.5* 2.0 1.8  PHOS 2.6 2.9 2.7 2.6 3.5   Liver Function Tests: Recent Labs  Lab 09/14/21 2150 09/15/21 0457 09/16/21 0419  AST 36 46* 29  ALT 17 21 15   ALKPHOS 70 53 49  BILITOT 0.9 0.7 0.6  PROT 8.4* 6.6 5.0*  ALBUMIN 4.3 3.5 2.6*   No results for input(s): "LIPASE", "AMYLASE" in the last 168 hours. No results for input(s): "AMMONIA" in the last 168 hours. CBC: Recent Labs  Lab 09/17/21 0307 09/17/21 1035 09/18/21 0248 09/19/21 0550 09/20/21 0520 09/21/21 0520  WBC 8.1  --  7.2 6.0 7.7 7.4  HGB 8.3* 9.5* 9.0* 8.6* 8.8* 9.1*  HCT 27.4* 30.3* 29.3* 27.5* 29.1* 30.1*  MCV 77.2*  --  75.5* 75.8* 77.0* 77.4*  PLT 426*  --  411* 381 415* 426*   Cardiac Enzymes: Recent Labs  Lab 09/15/21 0457 09/16/21 0419 09/17/21 1035 09/18/21 0247 09/19/21 0550  CKTOTAL 1,903* 672* 3,495* 2,913* 1,375*   BNP: Invalid input(s):  "POCBNP" CBG: Recent Labs  Lab 09/18/21 0717 09/18/21 1125 09/18/21 1516 09/18/21 1941 09/18/21 2341  GLUCAP 120* 139* 144* 144* 120*   D-Dimer No results for input(s): "DDIMER" in the last 72 hours. Hgb A1c No results for input(s): "HGBA1C" in the last 72 hours. Lipid Profile No results for input(s): "CHOL", "HDL", "LDLCALC", "TRIG", "CHOLHDL", "LDLDIRECT" in the last 72 hours. Thyroid function studies No results for input(s): "TSH", "T4TOTAL", "T3FREE", "THYROIDAB" in the last 72 hours.  Invalid input(s): "FREET3" Anemia work up No results for input(s): "VITAMINB12", "FOLATE", "FERRITIN", "TIBC", "IRON", "RETICCTPCT" in the last 72 hours. Urinalysis    Component Value Date/Time   COLORURINE AMBER (A) 09/15/2021 0015   APPEARANCEUR CLOUDY (A) 09/15/2021 0015   LABSPEC 1.021 09/15/2021 0015   PHURINE 7.0 09/15/2021 0015   GLUCOSEU NEGATIVE 09/15/2021 0015   HGBUR NEGATIVE 09/15/2021 0015   BILIRUBINUR NEGATIVE 09/15/2021 0015   KETONESUR NEGATIVE 09/15/2021 0015   PROTEINUR 30 (A) 09/15/2021 0015   NITRITE POSITIVE (A) 09/15/2021 0015   LEUKOCYTESUR TRACE (A) 09/15/2021 0015   Sepsis Labs Recent Labs  Lab 09/18/21 0248 09/19/21 0550 09/20/21 0520 09/21/21 0520  WBC 7.2 6.0 7.7 7.4   Microbiology Recent Results (from the past 240 hour(s))  MRSA Next Gen by PCR, Nasal     Status: Abnormal   Collection Time: 09/15/21 12:04 PM   Specimen: Nasal Mucosa; Nasal Swab  Result Value Ref Range Status   MRSA by PCR Next Gen DETECTED (A) NOT DETECTED Final    Comment: RESULT CALLED TO, READ BACK BY AND VERIFIED WITH: SHELLEY FIELDS, RN 1319 09/15/21 GM (NOTE) The GeneXpert MRSA Assay (FDA approved for NASAL specimens only), is one component of a comprehensive MRSA colonization surveillance program. It is not intended to diagnose MRSA infection nor to guide or monitor treatment for MRSA infections. Test performance is not FDA approved in patients less than 97  years old. Performed at [facility], [address] [place], [place] [address]   Culture, blood (Routine X 2) w  Reflex to ID Panel     Status: None   Collection Time: 09/15/21  3:17 PM   Specimen: BLOOD  Result Value Ref Range Status   Specimen Description BLOOD BLOOD RIGHT HAND  Final   Special Requests   Final    BOTTLES DRAWN AEROBIC ONLY Blood Culture results may not be optimal due to an inadequate volume of blood received in culture bottles   Culture   Final    NO GROWTH 5 DAYS Performed at [facility], [address], [place], [place] [address]    Report Status 09/20/2021 FINAL  Final  Culture, blood (Routine X 2) w Reflex to ID Panel     Status: None   Collection Time: 09/16/21 12:22 AM   Specimen: BLOOD  Result Value Ref Range Status   Specimen Description BLOOD RIGHT HAND  Final   Special Requests IN PEDIATRIC BOTTLE Blood Culture adequate volume  Final   Culture   Final    NO GROWTH 5 DAYS Performed at [facility], [address], [place], [place] [address]    Report Status 09/21/2021 FINAL  Final   Imaging DG Finger Thumb Right  Result Date: 09/15/2021 CLINICAL DATA:  Right thumb fracture. EXAM: RIGHT THUMB 2+V COMPARISON:  None Available. FINDINGS: There is no evidence of fracture or dislocation. There is no evidence of arthropathy or other focal bone abnormality. Soft tissues are unremarkable. IMPRESSION: Negative. Electronically Signed   By: Marijo Conception M.D.   On: 09/15/2021 17:48   DG Abd 1 View  Result Date: 09/15/2021 CLINICAL DATA:  36 year old female intubated. Possible overdose. EXAM: ABDOMEN - 1 VIEW COMPARISON:  Portable chest x-ray 0432 hours today. FINDINGS: Portable AP supine view at 0450 hours. Gas-filled but nondilated small and large bowel throughout the abdomen and pelvis. Enteric tube in the left upper quadrant with side hole at the level of the gastric body. No gastric distention is evident. No acute osseous  abnormality identified. Pelvic catheter partially visible in the midline, likely Foley. IMPRESSION: 1. Gas-filled but nondilated small and large bowel loops may indicate ileus. 2. Satisfactory enteric tube placement. Probable Foley catheter in the midline pelvis. Electronically Signed   By: Genevie Ann M.D.   On: 09/15/2021 04:58   DG Chest Port 1 View  Result Date: 09/15/2021 CLINICAL DATA:  36 year old female intubated.  Possible overdose. EXAM: PORTABLE CHEST 1 VIEW COMPARISON:  None Available. FINDINGS: Portable AP supine view at 0432 hours. Satisfactory endotracheal tube tip between the clavicles and carina. Enteric tube placed into the stomach, side hole the level of the gastric body. Right IJ approach central line placed, tip is below the superior cavoatrial junction, 1-2 cm into the right atrium. Lung volumes and mediastinal contours are within normal limits. No pneumothorax. Allowing for portable technique the lungs are clear. Paucity of bowel gas in the upper abdomen. No osseous abnormality identified. IMPRESSION: 1. Right IJ approach central line placed, tip likely within the right atrium. Recommend retraction of 1 cm for cavoatrial junction level placement. 2. Satisfactory endotracheal and enteric tubes. 3. No acute cardiopulmonary abnormality. Electronically Signed   By: Genevie Ann M.D.   On: 09/15/2021 04:57   CT Head Wo Contrast  Result Date: 09/14/2021 CLINICAL DATA:  Facial trauma. Possible overdose. Small injury under bridge of the nose. EXAM: CT HEAD WITHOUT CONTRAST TECHNIQUE: Contiguous axial images were obtained from the base of the skull through the vertex without intravenous contrast. RADIATION DOSE REDUCTION: This exam was performed  according to the departmental dose-optimization program which includes automated exposure control, adjustment of the mA and/or kV according to patient size and/or use of iterative reconstruction technique. COMPARISON:  None Available. FINDINGS: Brain: No evidence  of acute infarction, hemorrhage, hydrocephalus, extra-axial collection or mass lesion/mass effect. Vascular: No hyperdense vessel or unexpected calcification. Skull: Normal. Negative for fracture or focal lesion. Sinuses/Orbits: No acute finding. Other: None. IMPRESSION: No acute intracranial abnormality. Electronically Signed   By: Keane Police D.O.   On: 09/14/2021 22:30      Time coordinating discharge: over 30 minutes  SIGNED:  Emeterio Reeve DO [facility]

## 2021-09-21 NOTE — Progress Notes (Signed)
Report given to [place]. Patient alert and oriented. No complaints of pain or shortness of breath. Discontinuation of patients Involuntary commitment papers in chart. Continue to assess.

## 2021-09-21 NOTE — BH Assessment (Signed)
Informed  Albertine Patricia  RN  that pt  IVC  has been  rescinded

## 2021-09-21 NOTE — BH Assessment (Signed)
IVC PAPERS  RESCINDED  PER  JAMIE  LORD  NP

## 2021-09-21 NOTE — Consult Note (Signed)
PHARMACY CONSULT NOTE  Pharmacy Consult for Electrolyte Monitoring and Replacement   Recent Labs: Potassium (mmol/L)  Date Value  09/21/2021 3.5   Magnesium (mg/dL)  Date Value  20/94/7096 1.8   Calcium (mg/dL)  Date Value  28/36/6294 8.6 (L)   Albumin (g/dL)  Date Value  76/54/6503 2.6 (L)   Phosphorus (mg/dL)  Date Value  54/65/6812 3.5   Sodium (mmol/L)  Date Value  09/21/2021 139   Corrected Ca: 9 mg/dL  Assessment: 36 y.o female with significant PMH of anxiety and depression, polysubstance abuse disorder, pyelonephritis, hypothyroidism, ADD, asthma, migraine headaches who presented to the ED on 8/31 with chief complaints of extreme agitation and possible drug withdrawals. Pt is intubated.   MIVF: on LR @100ml /h Nutrition: supplement TID  Goal of Therapy:  WNL  Plan:  K: 3.5>3.5 - WNL today, no further repletion. Mg: 2.0>1.8 - WNL today, no further repletion. Recheck electrolytes in AM  , PharmD, [facility] Clinical Pharmacist 09/21/2021 8:35 AM

## 2022-02-27 IMAGING — US US RENAL
1 series · 14 of 25 positions shown · non-contrast
Comparison: None.

CLINICAL DATA: Abdominal flank pain, pregnant

EXAM:
RENAL / URINARY TRACT ULTRASOUND COMPLETE

[Series 1: us renal · 14 of 32 slices shown]
[im 1/32]
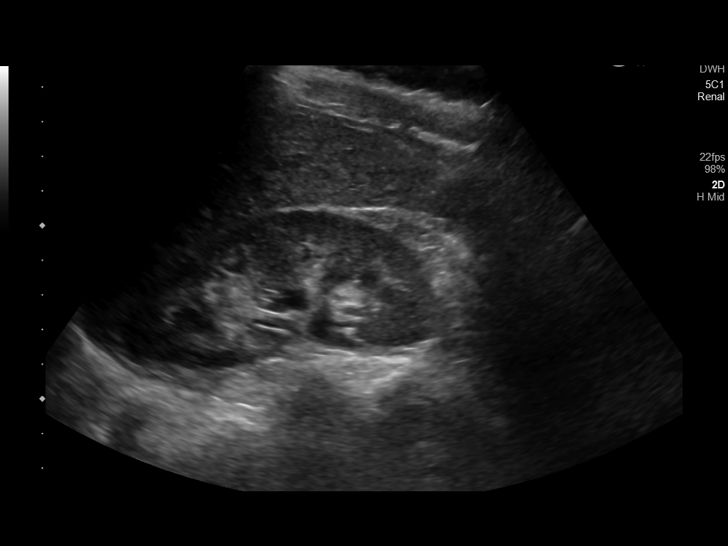
[im 3/32]
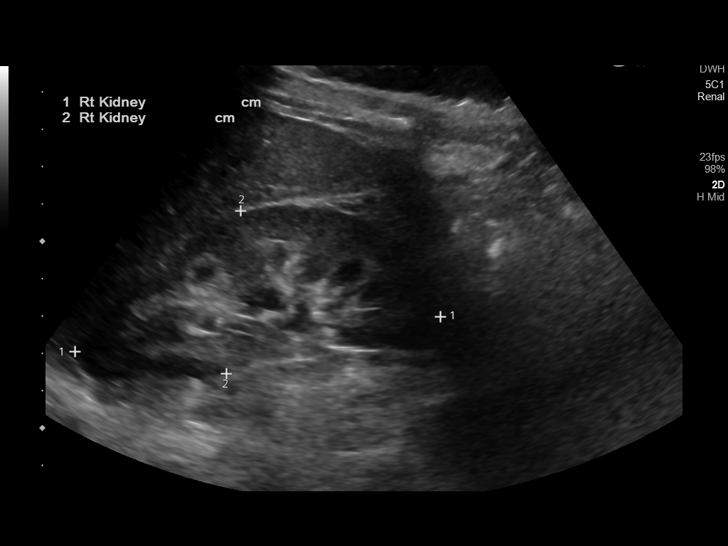
[im 6/32]
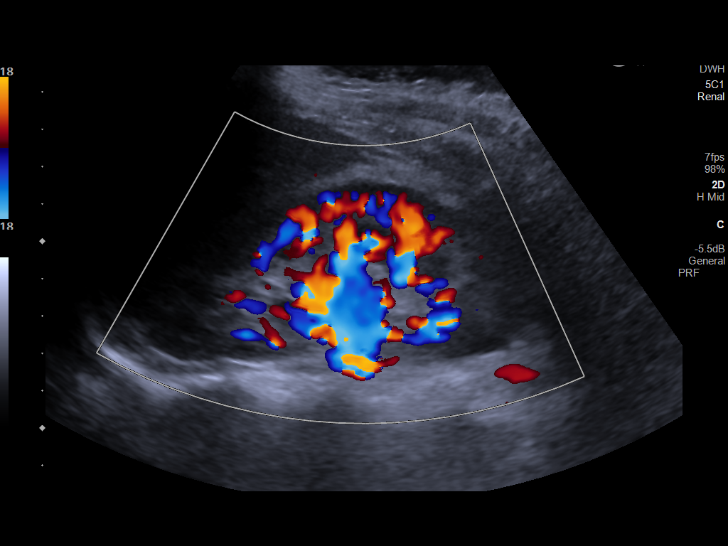
[im 8/32]
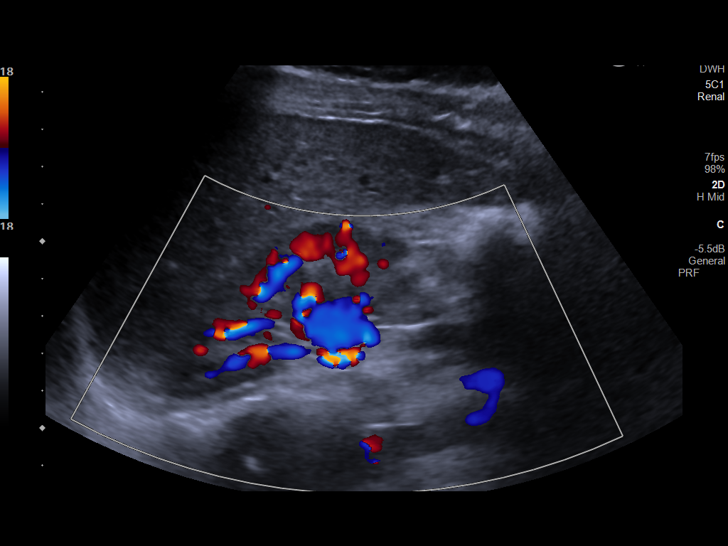
[im 11/32]
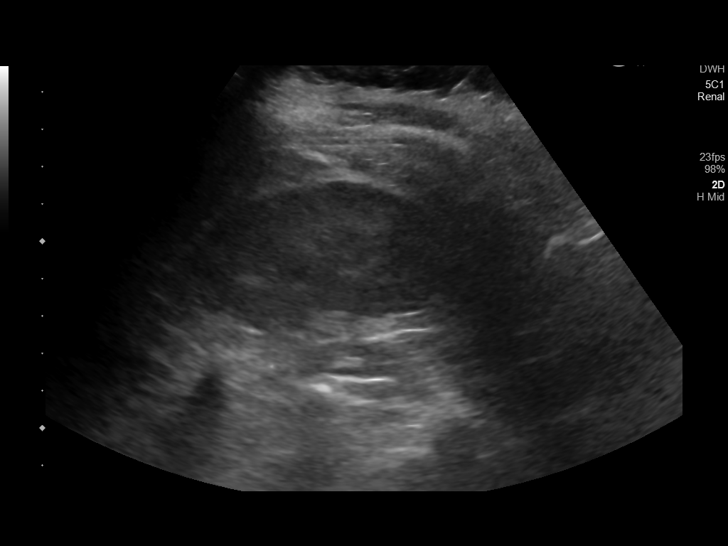
[im 12/32]
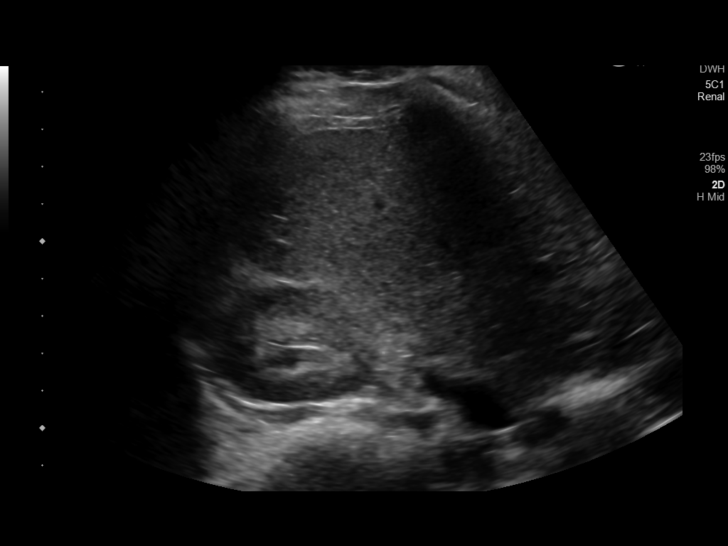
[im 15/32]
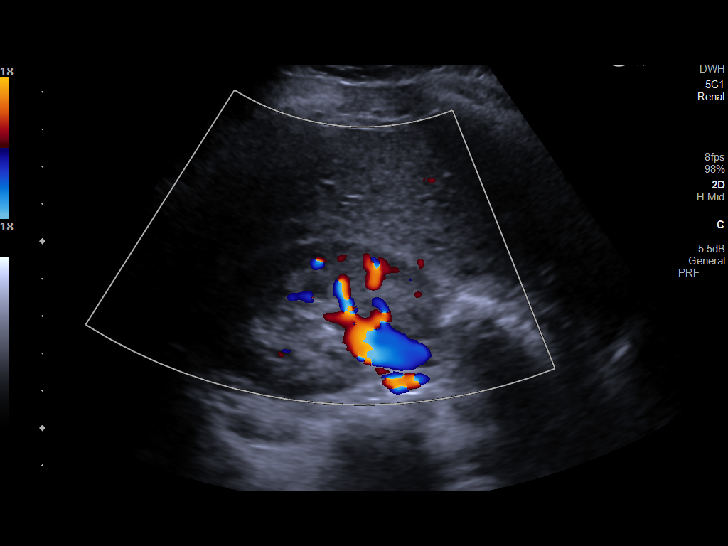
[im 17/32]
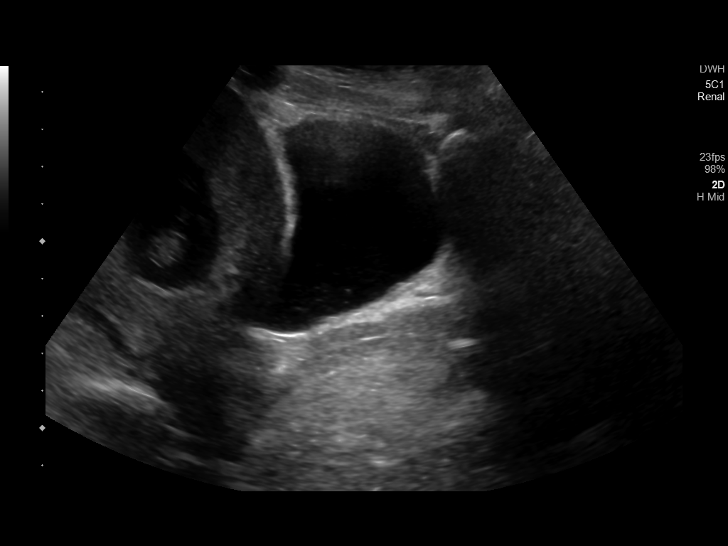
[im 20/32]
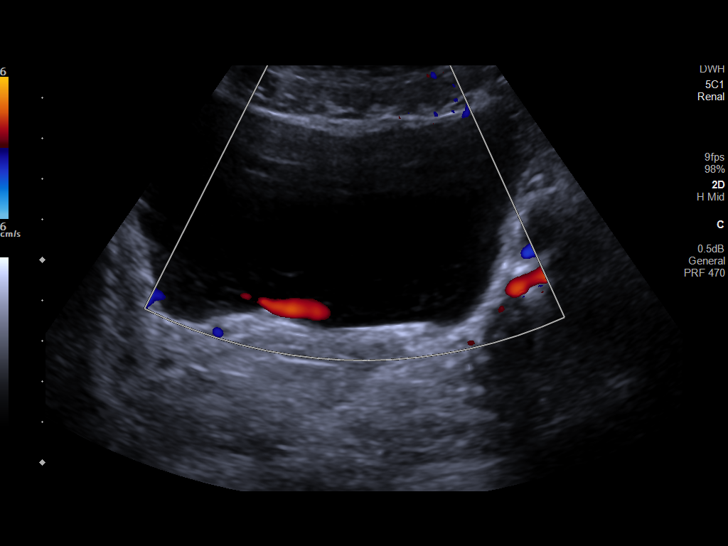
[im 21/32]
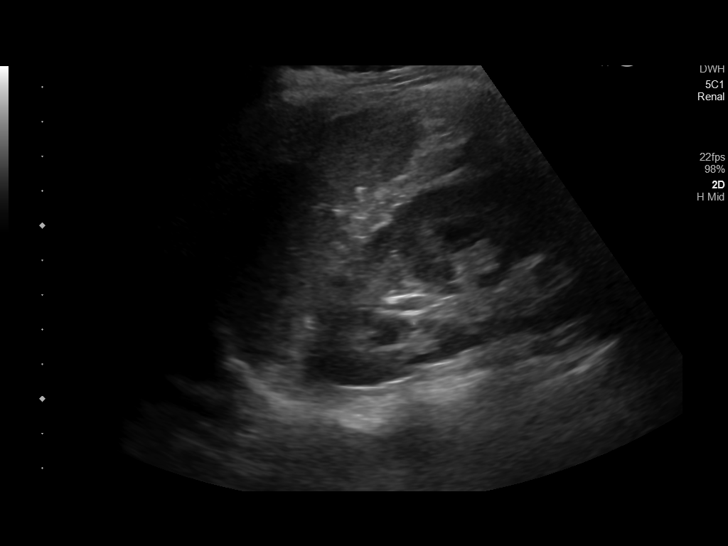
[im 24/32]
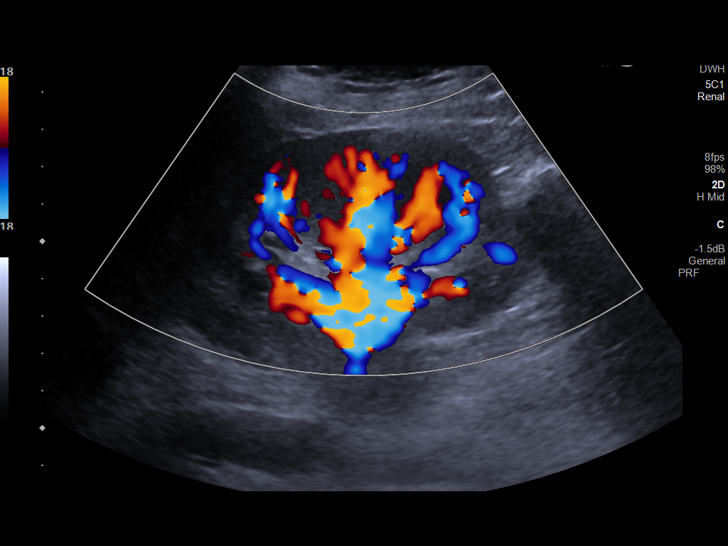
[im 26/32]
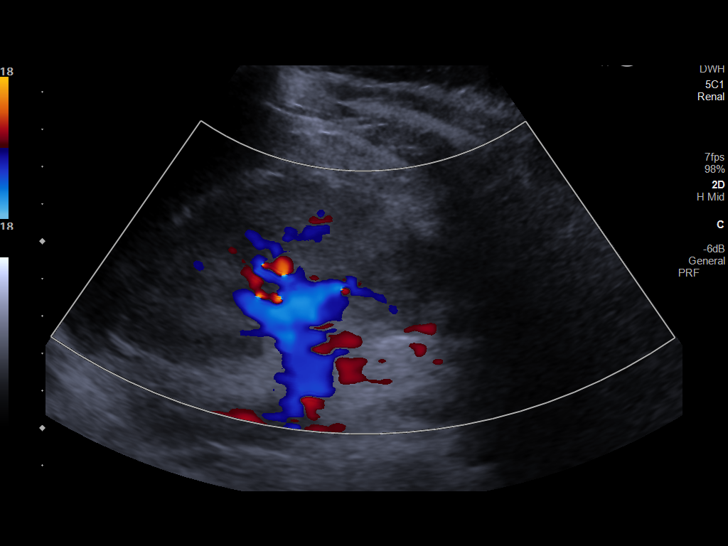
[im 29/32]
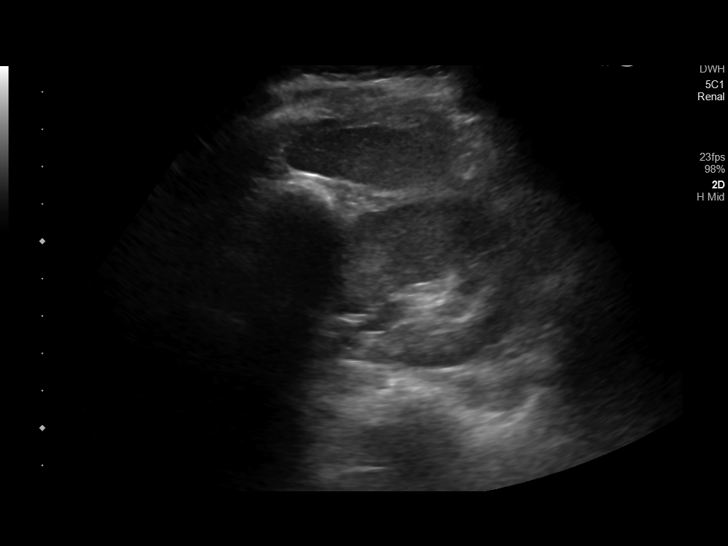
[im 32/32]
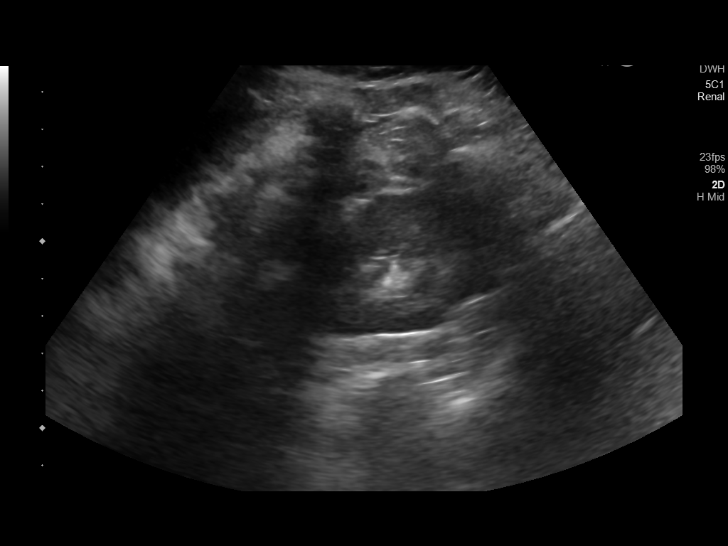

[14 of 25 positions shown; findings below may reference images not displayed]

FINDINGS: Right Kidney:

Renal measurements: 9.8 x 4.4 x 4.6 cm = volume: 104.2 mL.
Echogenicity is within normal limits. No concerning renal mass,
shadowing calculus or hydronephrosis.

Left Kidney:

Renal measurements: 10 x 5.4 x 4.9 cm = volume: 139.3 mL.
Echogenicity is within normal limits. No concerning renal mass,
shadowing calculus or hydronephrosis.

Bladder:

Appears normal for degree of bladder distention. Bilateral bladder
jets are seen.

Other:

None.
IMPRESSION: Unremarkable ultrasound of the urinary tract without visible
urolithiasis or hydronephrosis.

## 2022-02-27 IMAGING — US US OB COMP LESS 14 WK
1 series · 14 of 28 positions shown · non-contrast
Comparison: None.

CLINICAL DATA: Lower abdominal pain

EXAM:
OBSTETRIC <14 WK ULTRASOUND
TECHNIQUE: Transabdominal ultrasound was performed for evaluation of the
gestation as well as the maternal uterus and adnexal regions.

[Series 1: us ob less than 14 weeks with ob transvaginal · 14 of 60 slices shown]
[im 3/60]
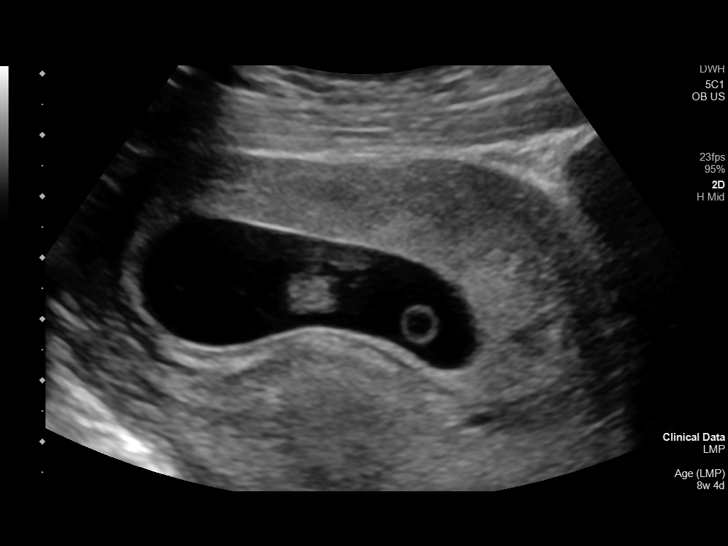
[im 7/60]
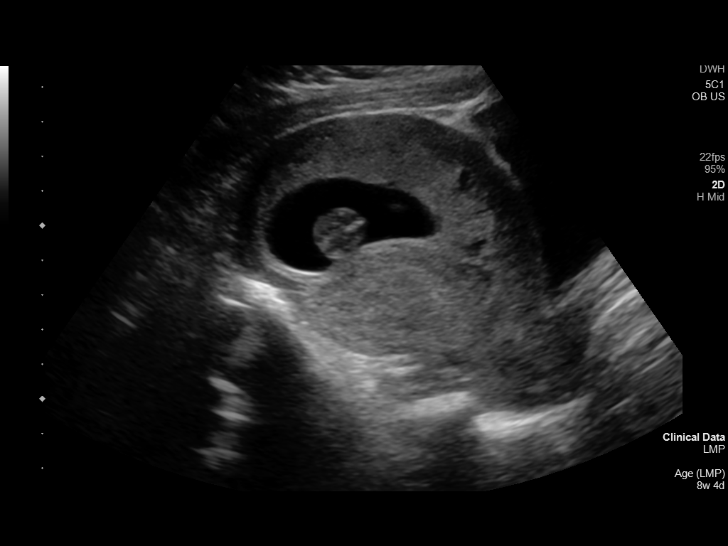
[im 11/60]
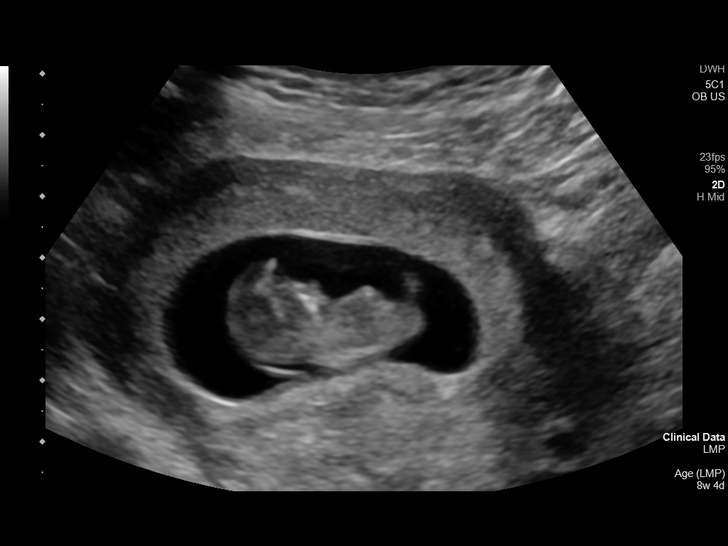
[im 16/60]
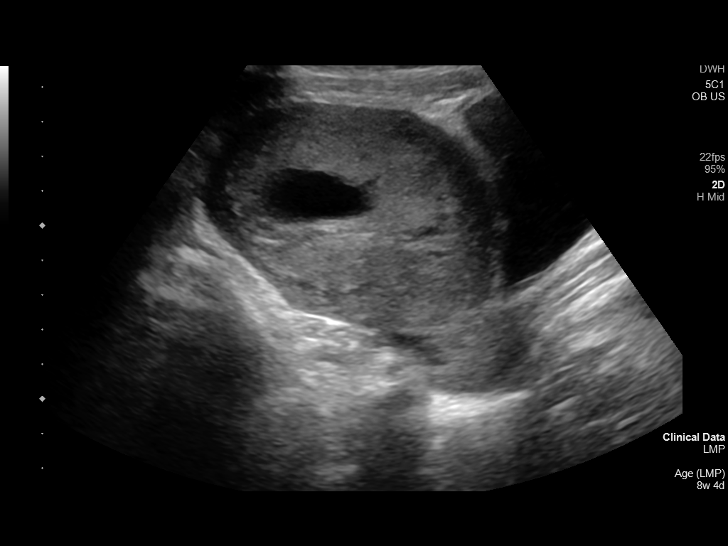
[im 20/60]
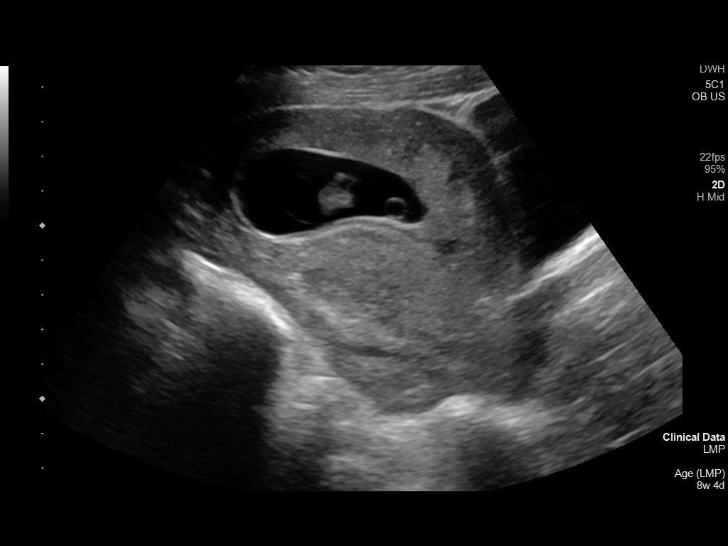
[im 25/60]
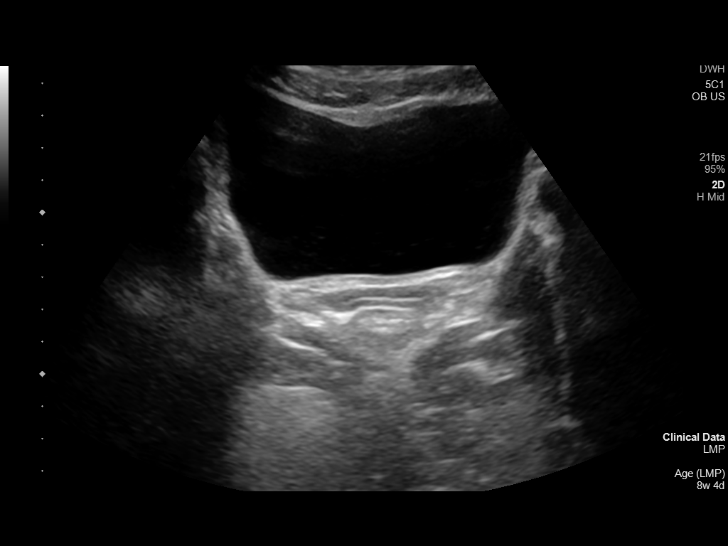
[im 29/60]
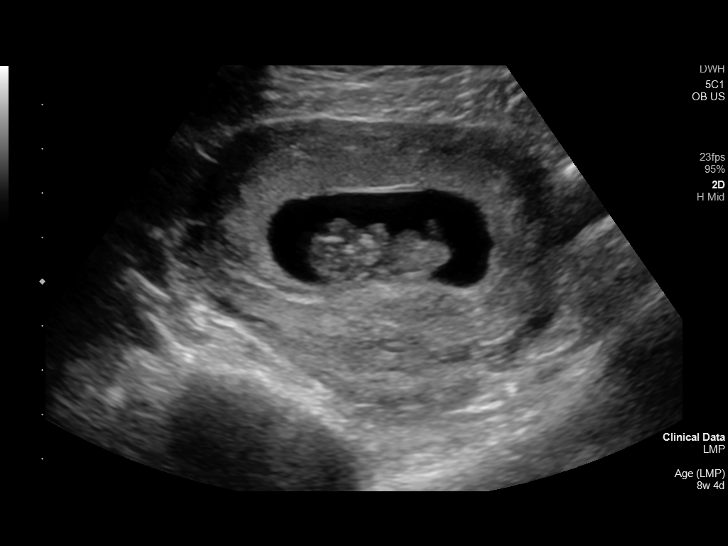
[im 33/60]
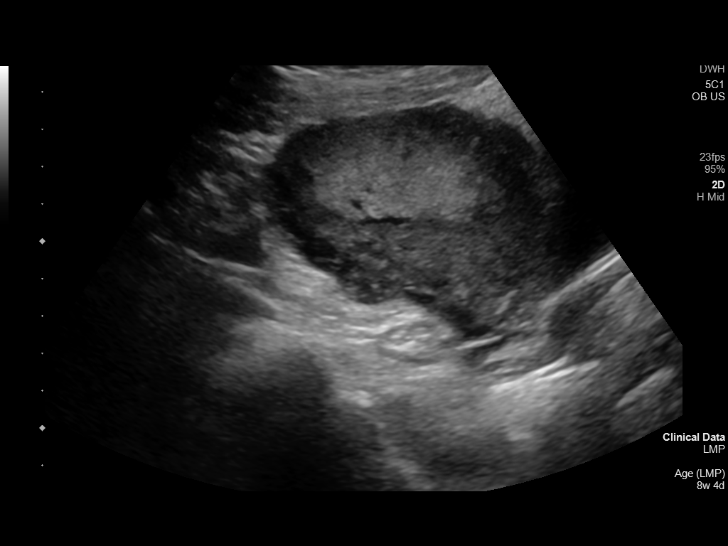
[im 38/60]
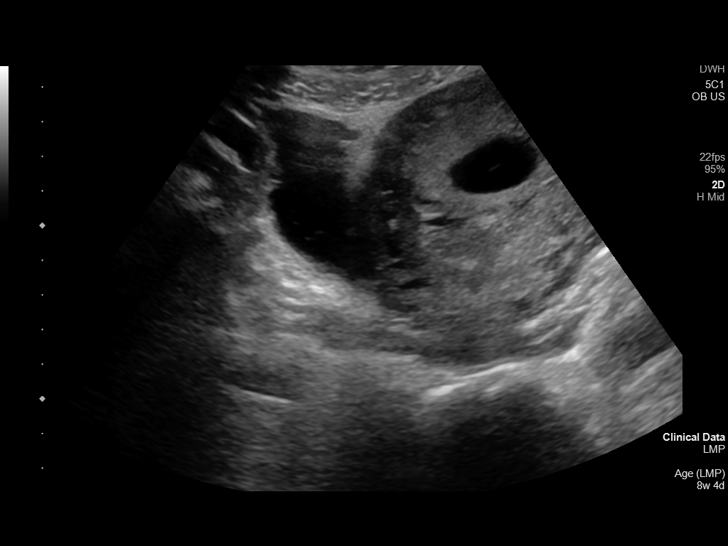
[im 42/60]
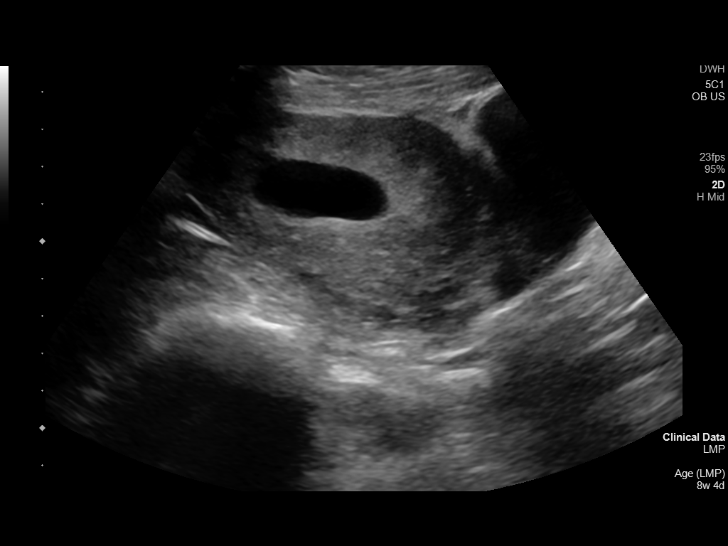
[im 46/60]
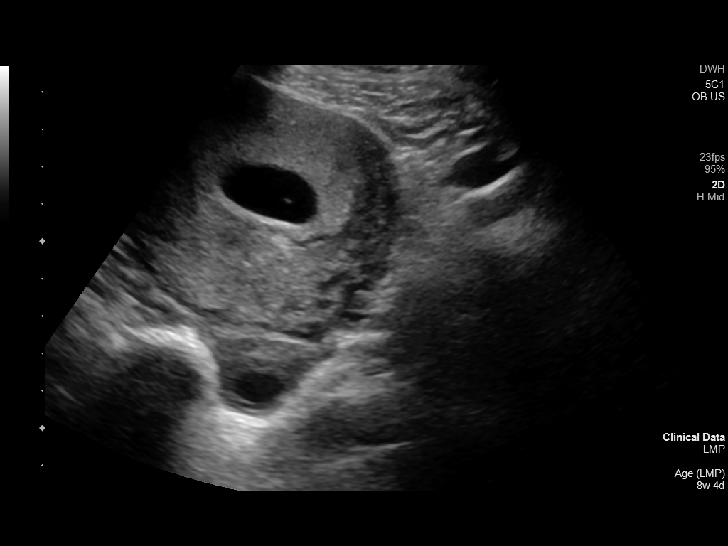
[im 51/60]
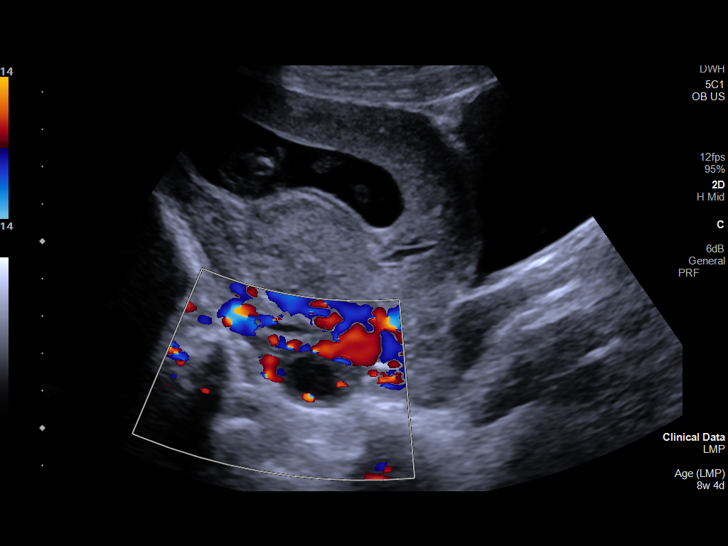
[im 55/60]
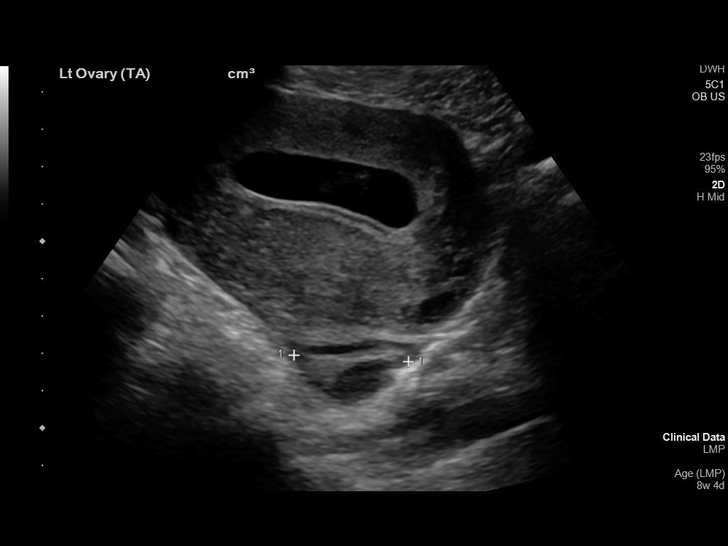
[im 60/60]
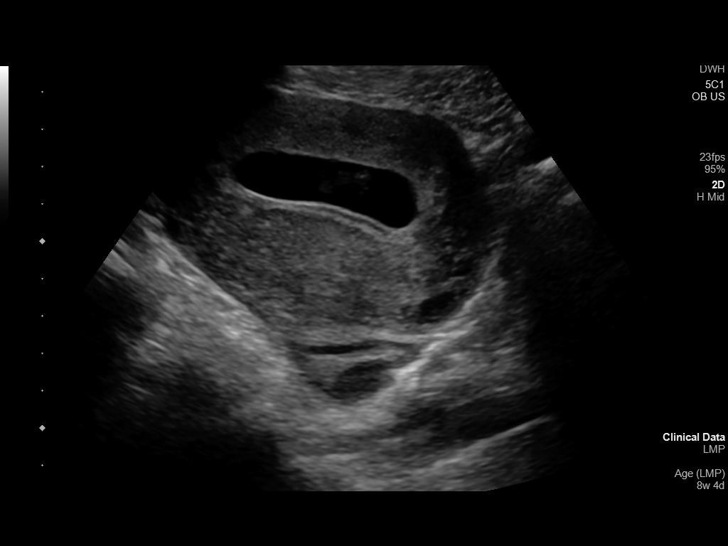

[14 of 28 positions shown; findings below may reference images not displayed]

FINDINGS: Intrauterine gestational sac: Single

Yolk sac:  Visualized.

Embryo:  Visualized.

Cardiac Activity: Visualized.

Heart Rate: 171 bpm

CRL:   32 mm   10 w 1 d                  US EDC: 10/30/2020

Subchorionic hemorrhage:  None visualized.

Maternal uterus/adnexae: Anteverted maternal uterus. Patient self
reports a surgical absence of the right ovary. Left ovary contains a
2.5 cm corpus luteum, normal physiologic finding. No free fluid.
IMPRESSION: Single intrauterine gestation at 10 weeks, 1 day by crown-rump
length sonographic estimation.

Patient self reports a surgical absence of the right ovary with
nonvisualization on this exam, correlate with surgical history.

## 2022-08-25 IMAGING — US US PELVIS COMPLETE
1 series · 15 of 25 positions shown · non-contrast
Comparison: None

CLINICAL DATA: Postpartum hemorrhage



[Series 1: us pelvis complete · 40 acquisitions, 15 frames shown]
[im 1/40]
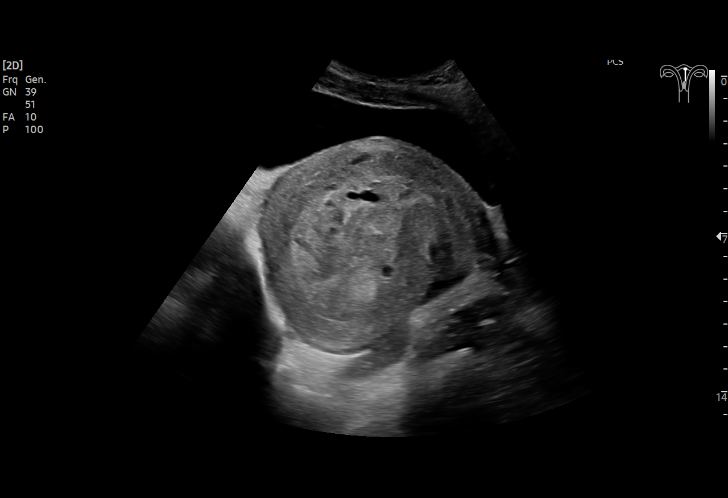
[im 4/40]
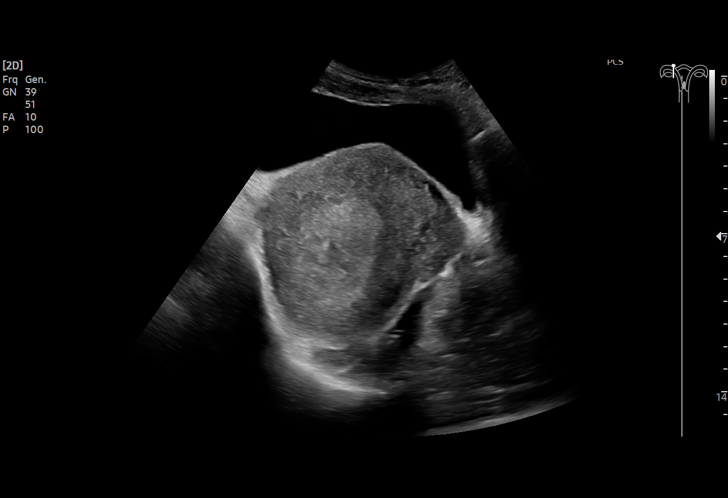
[im 7/40]
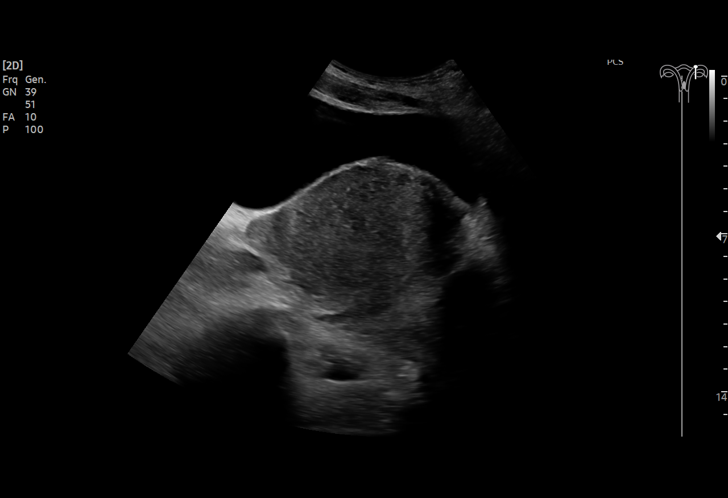
[im 9/40]
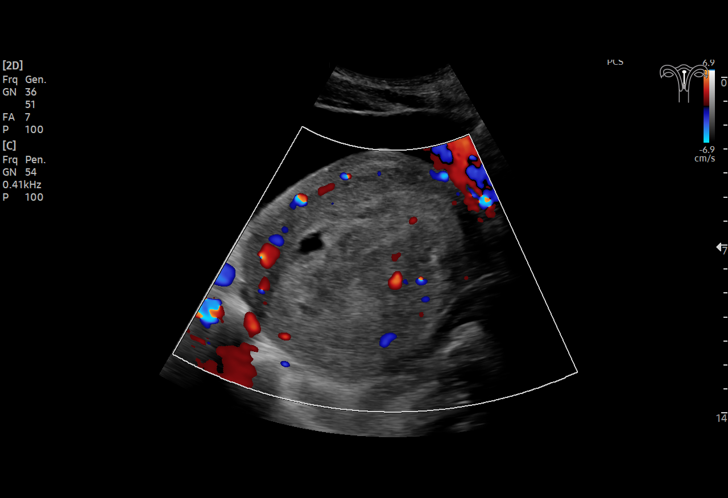
[im 12/40]
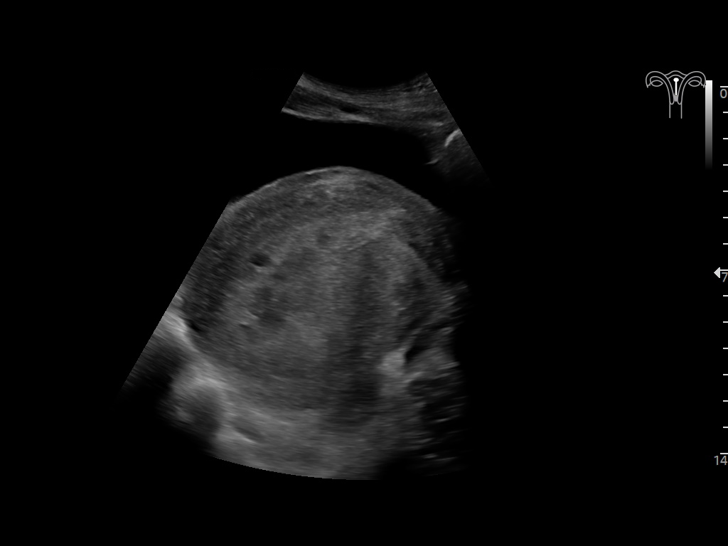
[im 15/40]
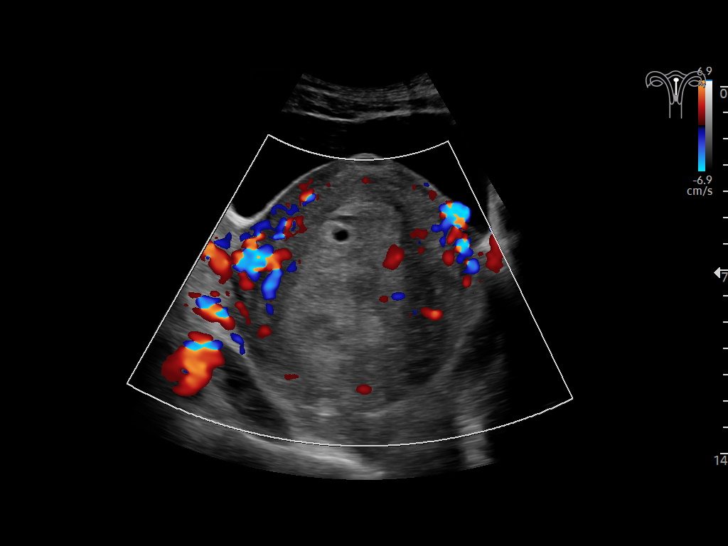
[im 17/40]
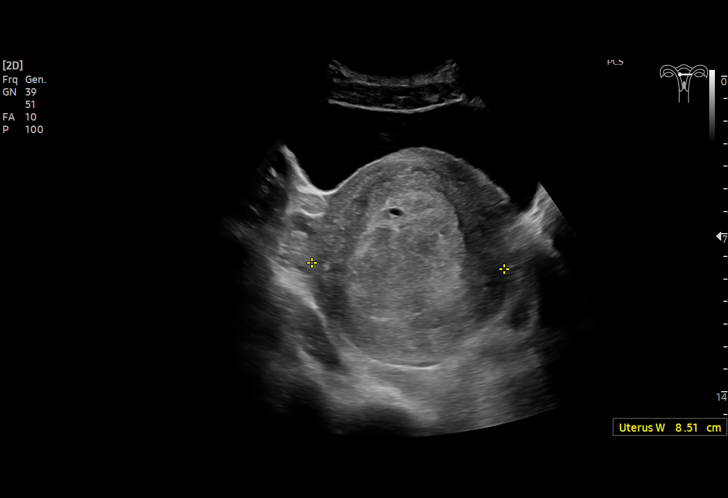
[im 20/40]
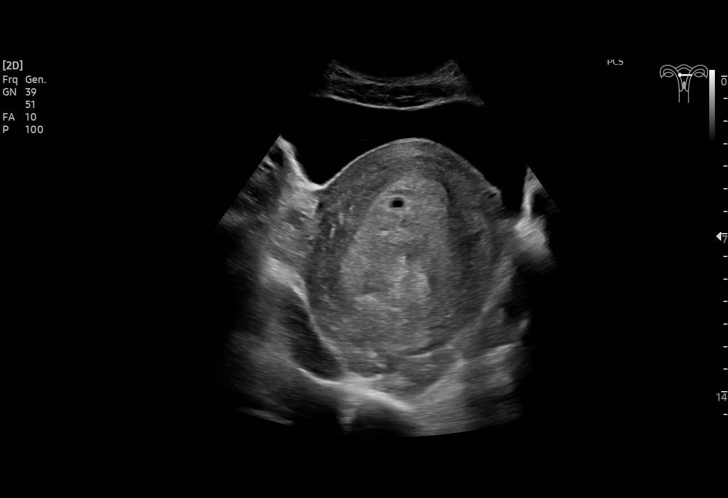
[im 23/40]
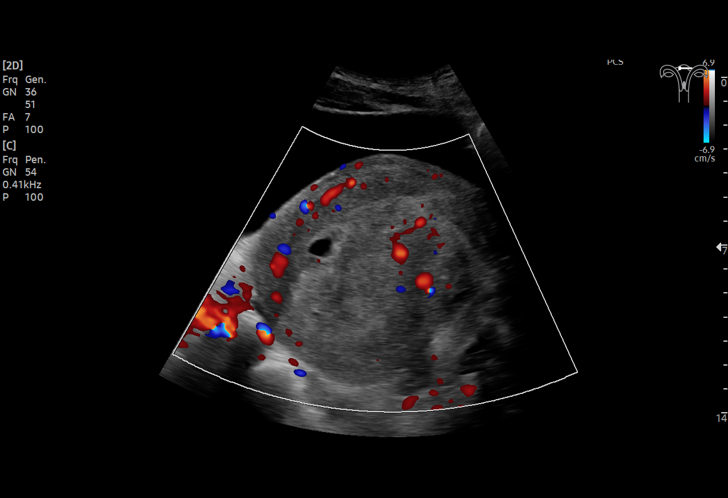
[im 25/40]
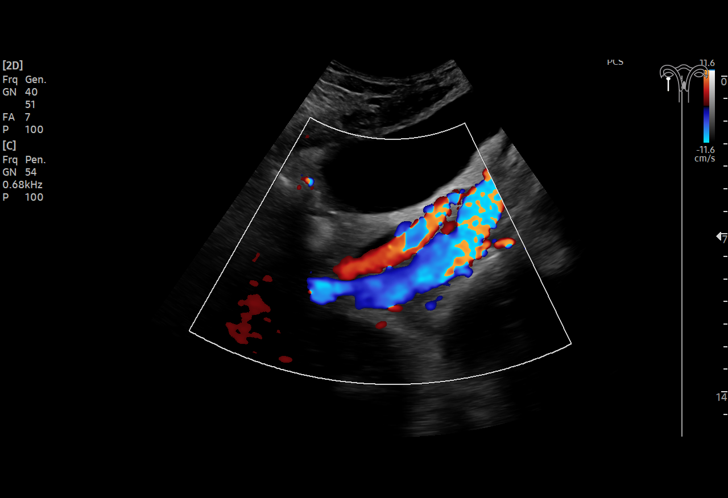
[im 28/40]
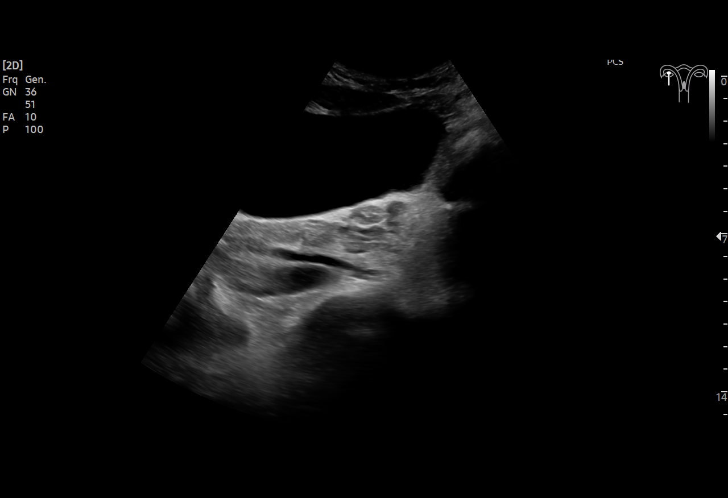
[im 31/40]
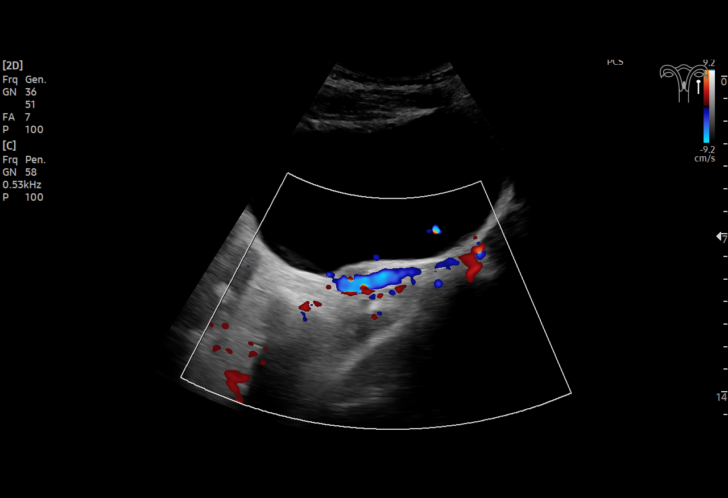
[im 33/40]
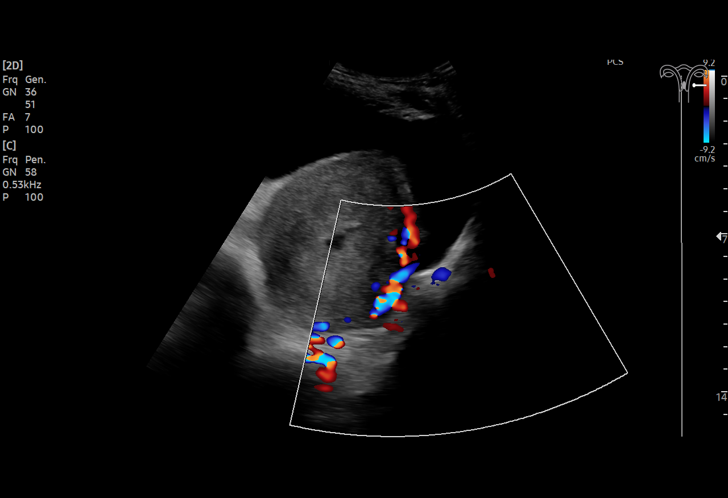
[im 36/40]
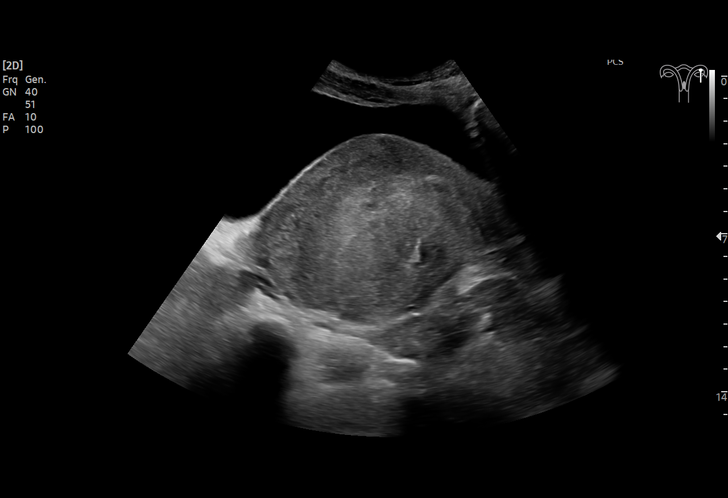
[im 40/40]
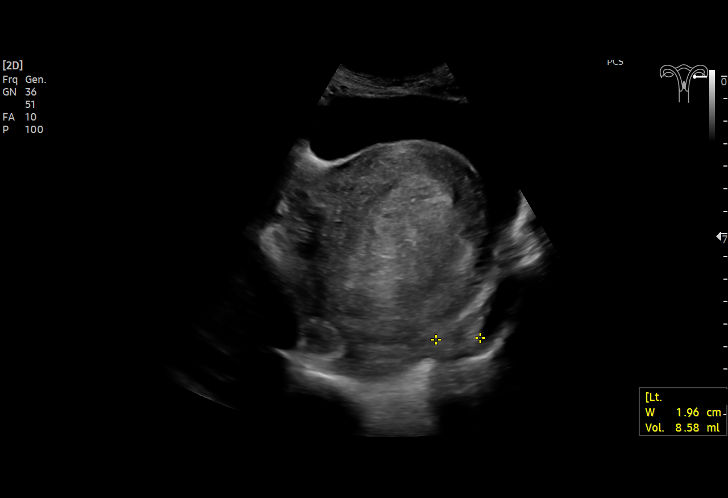

[15 of 25 positions shown; findings below may reference images not displayed]

FINDINGS: Uterus

Measurements: 12.4 x 8.2 x 8.5 cm = volume: 452 mL. No fibroids or
other mass visualized.

Endometrium

Thickness: 57 mm.  Thickened, heterogeneous endometrium.

Right ovary

Surgically absent.

Left ovary

Measurements: 4.3 x 1.9 x 2.0 cm = volume: 9 mL. Normal
appearance/no adnexal mass.

Other findings

No abnormal free fluid.
IMPRESSION: Thickened, heterogeneous endometrium measuring up to 57 mm. Findings
are concerning for retained products of conception.

## 2023-01-25 ENCOUNTER — Ambulatory Visit (HOSPITAL_COMMUNITY)
Admission: EM | Admit: 2023-01-25 | Discharge: 2023-01-25 | Disposition: A | Discharge status: Home / Self Care | Payer: 59 | Attending: Emergency Medicine | Admitting: Emergency Medicine

## 2023-01-25 ENCOUNTER — Encounter (HOSPITAL_COMMUNITY): Payer: Self-pay

## 2023-01-25 ENCOUNTER — Ambulatory Visit (INDEPENDENT_AMBULATORY_CARE_PROVIDER_SITE_OTHER): Payer: 59

## 2023-01-25 DIAGNOSIS — L03113 Cellulitis of right upper limb: Secondary | ICD-10-CM | POA: Diagnosis not present

## 2023-01-25 DIAGNOSIS — M79641 Pain in right hand: Secondary | ICD-10-CM

## 2023-01-25 MED ORDER — ACETAMINOPHEN 325 MG PO TABS
ORAL_TABLET | ORAL | Status: AC
  Filled 2023-01-25: qty 2

## 2023-01-25 MED ORDER — ACETAMINOPHEN 325 MG PO TABS
650.0000 mg | ORAL_TABLET | Freq: Once | ORAL | Status: AC
  Administered 2023-01-25: 650 mg via ORAL

## 2023-01-25 MED ORDER — SULFAMETHOXAZOLE-TRIMETHOPRIM 800-160 MG PO TABS: 1.0000 | ORAL_TABLET | Freq: Two times a day (BID) | ORAL | 0 refills | Status: DC

## 2023-01-25 NOTE — ED Triage Notes (Signed)
 Patient presents with right hand swelling and pain x 3 days.

## 2023-01-25 NOTE — ED Provider Notes (Signed)
 [facility]    CSN: [id number] Arrival date & time: 01/25/23  1407      History   Chief Complaint No chief complaint on file.   HPI Cassandra [name] is a 38 y.o. female.   Patient has had right hand pain and swelling for the past 2 days.  Woke her up during the middle the night throbbing.  It was hot and swollen.  There is an area of concern at the base of her thumb in the webbing between the thumb and index finger, is concerned she may have had a spider bite.  Denies picking at the skin.  Denies any IV drug use.  Reports her last drug use was a few months ago.  No fevers.  Has been taking ibuprofen , last took an hour prior to arrival.  History of ADHD, anxiety, asthma, kidney stones, hypothyroidism, drug overdose and polysubstance abuse.  Patient reports a history of Ehlers-Danlos syndrome.  The history is provided by the patient and medical records.    Past Medical History:  Diagnosis Date   ADHD (attention deficit hyperactivity disorder)    Anxiety    Asthma    Kidney stone     Patient Active Problem List   Diagnosis Date Noted   Drug-induced delirium    Psychosis (HCC)    Drug overdose 09/15/2021   Amphetamine  abuse (HCC)    Cocaine use    Poisoning by cocaine, intentional self-harm (HCC)    Anxiety and depression 07/30/2016   Hypothyroidism 07/30/2016   Polysubstance dependence (HCC) 07/30/2016    Past Surgical History:  Procedure Laterality Date   ELBOW FRACTURE SURGERY     EYE MUSCLE SURGERY Bilateral    OVARIAN CYST REMOVAL Right    TONSILLECTOMY      OB History     Gravida  5   Para  4   Term  3   Preterm  1   AB  1   Living  4      SAB      IAB      Ectopic  1   Multiple      Live Births  4            Home Medications    Prior to Admission medications   Medication Sig Start Date End Date Taking? Authorizing Provider  sulfamethoxazole -trimethoprim  (BACTRIM  DS) 800-160 MG tablet Take 1 tablet by mouth 2 (two)  times daily for 7 days. 01/25/23 02/01/23 Yes Alleah Dearman  N, FNP  ADDERALL  XR 30 MG 24 hr capsule Take 30 mg by mouth 2 (two) times daily. 09/01/20   [provider]  baclofen (LIORESAL) 10 MG tablet Take 10 mg by mouth 3 (three) times daily as needed. 08/17/21   [provider]  buprenorphine  (SUBUTEX ) 8 MG SUBL SL tablet Place 8 mg under the tongue 2 (two) times daily. 09/09/20   [provider]  buPROPion  (WELLBUTRIN  SR) 150 MG 12 hr tablet Take 1 tablet (150 mg total) by mouth every morning. 09/21/21   Alexander, Natalie, DO  cloNIDine  (CATAPRES ) 0.2 MG tablet Take 1 tablet (0.2 mg total) by mouth daily for 2 days. 09/22/21 09/24/21  Alexander, Natalie, DO  escitalopram  (LEXAPRO ) 20 MG tablet Take 1 tablet (20 mg total) by mouth daily. 09/21/21   Alexander, Natalie, DO  etonogestrel -ethinyl estradiol  (NUVARING) 0.12-0.015 MG/24HR vaginal ring Insert vaginally and leave in place for 3 consecutive weeks, then remove for 1 week. 10/31/20   Lake Read, MD  gabapentin  (  NEURONTIN ) 300 MG capsule Take 1 capsule (300 mg total) by mouth 2 (two) times daily. 09/21/21   Alexander, Natalie, DO  meloxicam  (MOBIC ) 15 MG tablet One tab PO qAM with breakfast for 2 weeks, then daily prn pain. 09/21/21   Alexander, Natalie, DO  Multiple Vitamin (MULTIVITAMIN WITH MINERALS) TABS tablet Take 1 tablet by mouth daily. 09/21/21   Alexander, Natalie, DO  naloxone  (NARCAN ) nasal spray 4 mg/0.1 mL 1 spray once. 07/13/21   [provider]  nicotine polacrilex (NICORETTE) 2 MG gum SMARTSIG:1 Each By Mouth Every 2 Hours PRN 05/08/21   [provider]  pantoprazole  (PROTONIX ) 40 MG tablet Take 1 tablet (40 mg total) by mouth daily. 09/21/21 10/21/21  Alexander, Natalie, DO  traZODone  (DESYREL ) 100 MG tablet Take 2 tablets (200 mg total) by mouth at bedtime as needed. 09/21/21   Alexander, Natalie, DO    Family History History reviewed. No pertinent family history.  Social History Social  History   Tobacco Use   Smoking status: Every Day    Current packs/day: 0.50    Types: Cigarettes   Smokeless tobacco: Never  Vaping Use   Vaping status: Never Used  Substance Use Topics   Alcohol use: Not Currently   Drug use: Not Currently     Allergies   Metronidazole and Prednisone   Review of Systems Review of Systems   Physical Exam Triage Vital Signs ED Triage Vitals  Encounter Vitals Group     BP 01/25/23 1427 108/76     Systolic BP Percentile --      Diastolic BP Percentile --      Pulse Rate 01/25/23 1429 98     Resp 01/25/23 1427 18     Temp 01/25/23 1427 98 F (36.7 C)     Temp Source 01/25/23 1427 Oral     SpO2 01/25/23 1427 98 %     Weight --      Height --      Head Circumference --      Peak Flow --      Pain Score --      Pain Loc --      Pain Education --      Exclude from Growth Chart --    No data found.  Updated Vital Signs BP 108/76 (BP Location: Left Arm)   Pulse 98   Temp 98 F (36.7 C) (Oral)   Resp 18   LMP 01/16/2023 (Approximate)   SpO2 98%   Visual Acuity Right Eye Distance:   Left Eye Distance:   Bilateral Distance:    Right Eye Near:   Left Eye Near:    Bilateral Near:     Physical Exam Vitals and nursing note reviewed.  Constitutional:      Appearance: Normal appearance.  HENT:     Head: Normocephalic and atraumatic.     Right Ear: External ear normal.     Left Ear: External ear normal.     Nose: Nose normal.     Mouth/Throat:     Mouth: Mucous membranes are moist.  Eyes:     Conjunctiva/sclera: Conjunctivae normal.  Cardiovascular:     Rate and Rhythm: Normal rate.  Pulmonary:     Effort: Pulmonary effort is normal. No respiratory distress.  Musculoskeletal:        General: Normal range of motion.  Skin:    General: Skin is warm and dry.     Capillary Refill: Capillary refill takes less than 2 seconds.  Findings: Erythema present.       Neurological:     General: No focal deficit present.      Mental Status: She is alert and oriented to person, place, and time.  Psychiatric:        Mood and Affect: Mood normal.        Behavior: Behavior normal. Behavior is cooperative.      UC Treatments / Results  Labs (all labs ordered are listed, but only abnormal results are displayed) Labs Reviewed - No data to display  EKG   Radiology DG Hand Complete Right Result Date: 01/25/2023 CLINICAL DATA:  Swelling and pain EXAM: RIGHT HAND - COMPLETE 3+ VIEW COMPARISON:  None Available. FINDINGS: There is no evidence of fracture or dislocation. There is no evidence of arthropathy or other focal bone abnormality. Soft tissues are unremarkable. No radiodense foreign body. IMPRESSION: Negative. Electronically Signed   By: JONETTA Faes M.D.   On: 01/25/2023 15:06    Procedures Procedures (including critical care time)  Medications Ordered in UC Medications  acetaminophen  (TYLENOL ) tablet 650 mg (650 mg Oral Given 01/25/23 1456)    Initial Impression / Assessment and Plan / UC Course  I have reviewed the triage vital signs and the nursing notes.  Pertinent labs & imaging results that were available during my care of the patient were reviewed by me and considered in my medical decision making (see chart for details).  Vitals in triage reviewed, patient is hemodynamically stable.  Cellulitis in the webbing between thumb and index finger of right hand.  Brisk capillary refill in fingers.  No obvious deformity.  History of polysubstance abuse and MRSA.  Concern for cellulitis.  Imaging does not reveal any fractures or acute foreign bodies.  Will cover with Bactrim .  Over-the-counter pain management discussed.  Plan of care, follow-up care return precautions given, no questions at this time.     Final Clinical Impressions(s) / UC Diagnoses   Final diagnoses:  Right hand pain  Cellulitis of right hand     Discharge Instructions      Your imaging does not reveal any acute fractures or  retained foreign bodies.  Due to your history of MRSA and drug use, I am covering you with Bactrim .  Take this twice daily for the next 7 days to help treat the infection in your hand.  You can alternate between 800 mg of ibuprofen  and 500 mg of Tylenol  every 4-6 hours for pain, swelling and inflammation.  Over the next 72 hours on antibiotics your symptoms should improve.  If no improvement please seek follow-up care at the nearest emergency department for further advanced evaluation.      ED Prescriptions     Medication Sig Dispense Auth. Provider   sulfamethoxazole -trimethoprim  (BACTRIM  DS) 800-160 MG tablet Take 1 tablet by mouth 2 (two) times daily for 7 days. 14 tablet Dreama, Scotlyn Mccranie  N, FNP      PDMP not reviewed this encounter.   Dreama Reita SAILOR, FNP 01/25/23 1511

## 2023-01-25 NOTE — Discharge Instructions (Addendum)
 Your imaging does not reveal any acute fractures or retained foreign bodies.  Due to your history of MRSA and drug use, I am covering you with Bactrim .  Take this twice daily for the next 7 days to help treat the infection in your hand.  You can alternate between 800 mg of ibuprofen  and 500 mg of Tylenol  every 4-6 hours for pain, swelling and inflammation.  Over the next 72 hours on antibiotics your symptoms should improve.  If no improvement please seek follow-up care at the nearest emergency department for further advanced evaluation.

## 2023-01-29 ENCOUNTER — Encounter (HOSPITAL_COMMUNITY): Payer: Self-pay

## 2023-01-29 ENCOUNTER — Other Ambulatory Visit: Payer: Self-pay

## 2023-01-29 ENCOUNTER — Emergency Department (HOSPITAL_COMMUNITY)
Admission: EM | Admit: 2023-01-29 | Discharge: 2023-01-29 | Disposition: A | Discharge status: Home / Self Care | Payer: 59 | Attending: Emergency Medicine | Admitting: Emergency Medicine

## 2023-01-29 ENCOUNTER — Emergency Department (HOSPITAL_COMMUNITY): Payer: 59

## 2023-01-29 DIAGNOSIS — L0390 Cellulitis, unspecified: Secondary | ICD-10-CM

## 2023-01-29 DIAGNOSIS — L02511 Cutaneous abscess of right hand: Secondary | ICD-10-CM | POA: Diagnosis not present

## 2023-01-29 DIAGNOSIS — L0291 Cutaneous abscess, unspecified: Secondary | ICD-10-CM

## 2023-01-29 DIAGNOSIS — Z20822 Contact with and (suspected) exposure to covid-19: Secondary | ICD-10-CM | POA: Diagnosis not present

## 2023-01-29 DIAGNOSIS — R601 Generalized edema: Secondary | ICD-10-CM | POA: Diagnosis not present

## 2023-01-29 LAB — CBC WITH DIFFERENTIAL/PLATELET
Abs Immature Granulocytes: 0.07 10*3/uL (ref 0.00–0.07)
Basophils Absolute: 0.1 10*3/uL (ref 0.0–0.1)
Basophils Relative: 1 %
Eosinophils Absolute: 0.1 10*3/uL (ref 0.0–0.5)
Eosinophils Relative: 1 %
HCT: 41 % (ref 36.0–46.0)
Hemoglobin: 13 g/dL (ref 12.0–15.0)
Immature Granulocytes: 1 %
Lymphocytes Relative: 8 %
Lymphs Abs: 1.1 10*3/uL (ref 0.7–4.0)
MCH: 27.3 pg (ref 26.0–34.0)
MCHC: 31.7 g/dL (ref 30.0–36.0)
MCV: 86.1 fL (ref 80.0–100.0)
Monocytes Absolute: 0.7 10*3/uL (ref 0.1–1.0)
Monocytes Relative: 5 %
Neutro Abs: 11.1 10*3/uL — ABNORMAL HIGH (ref 1.7–7.7)
Neutrophils Relative %: 84 %
Platelets: 496 10*3/uL — ABNORMAL HIGH (ref 150–400)
RBC: 4.76 MIL/uL (ref 3.87–5.11)
RDW: 13.4 % (ref 11.5–15.5)
WBC: 13 10*3/uL — ABNORMAL HIGH (ref 4.0–10.5)
nRBC: 0 % (ref 0.0–0.2)

## 2023-01-29 LAB — COMPREHENSIVE METABOLIC PANEL
ALT: 10 U/L (ref 0–44)
AST: 12 U/L — ABNORMAL LOW (ref 15–41)
Albumin: 3.5 g/dL (ref 3.5–5.0)
Alkaline Phosphatase: 77 U/L (ref 38–126)
Anion gap: 9 (ref 5–15)
BUN: 13 mg/dL (ref 6–20)
CO2: 21 mmol/L — ABNORMAL LOW (ref 22–32)
Calcium: 8.7 mg/dL — ABNORMAL LOW (ref 8.9–10.3)
Chloride: 104 mmol/L (ref 98–111)
Creatinine, Ser: 0.52 mg/dL (ref 0.44–1.00)
GFR, Estimated: >60 mL/min (ref >60)
Glucose, Bld: 95 mg/dL (ref 70–99)
Potassium: 4 mmol/L (ref 3.5–5.1)
Sodium: 134 mmol/L — ABNORMAL LOW (ref 135–145)
Total Bilirubin: 0.2 mg/dL (ref 0.0–1.2)
Total Protein: 7.5 g/dL (ref 6.5–8.1)

## 2023-01-29 LAB — I-STAT CG4 LACTIC ACID, ED: Lactic Acid, Venous: 1.1 mmol/L (ref 0.5–1.9)

## 2023-01-29 LAB — RESP PANEL BY RT-PCR (RSV, FLU A&B, COVID)  RVPGX2
Influenza A by PCR: NEGATIVE
Influenza B by PCR: NEGATIVE
Resp Syncytial Virus by PCR: NEGATIVE
SARS Coronavirus 2 by RT PCR: NEGATIVE

## 2023-01-29 LAB — HCG, SERUM, QUALITATIVE: Preg, Serum: NEGATIVE

## 2023-01-29 MED ORDER — LACTATED RINGERS IV SOLN: INTRAVENOUS | Status: DC

## 2023-01-29 MED ORDER — LACTATED RINGERS IV BOLUS (SEPSIS)
1000.0000 mL | Freq: Once | INTRAVENOUS | Status: AC
  Administered 2023-01-29: 1000 mL via INTRAVENOUS

## 2023-01-29 MED ORDER — OXYCODONE-ACETAMINOPHEN 5-325 MG PO TABS
1.0000 | ORAL_TABLET | Freq: Once | ORAL | Status: AC
  Administered 2023-01-29: 1 via ORAL
  Filled 2023-01-29: qty 1

## 2023-01-29 MED ORDER — DOXYCYCLINE HYCLATE 100 MG PO CAPS: 100.0000 mg | ORAL_CAPSULE | Freq: Two times a day (BID) | ORAL | 0 refills | Status: AC

## 2023-01-29 MED ORDER — MORPHINE SULFATE (PF) 4 MG/ML IV SOLN
4.0000 mg | Freq: Once | INTRAVENOUS | Status: AC
  Administered 2023-01-29: 4 mg via INTRAVENOUS
  Filled 2023-01-29: qty 1

## 2023-01-29 MED ORDER — FENTANYL CITRATE PF 50 MCG/ML IJ SOSY
75.0000 ug | PREFILLED_SYRINGE | Freq: Once | INTRAMUSCULAR | Status: AC
  Administered 2023-01-29: 75 ug via INTRAVENOUS
  Filled 2023-01-29: qty 2

## 2023-01-29 MED ORDER — VANCOMYCIN HCL IN DEXTROSE 1-5 GM/200ML-% IV SOLN
1000.0000 mg | Freq: Once | INTRAVENOUS | Status: DC
  Filled 2023-01-29: qty 200

## 2023-01-29 MED ORDER — LIDOCAINE HCL (PF) 1 % IJ SOLN
10.0000 mL | Freq: Once | INTRAMUSCULAR | Status: AC
  Administered 2023-01-29: 10 mL
  Filled 2023-01-29: qty 30

## 2023-01-29 MED ORDER — LACTATED RINGERS IV BOLUS (SEPSIS)
250.0000 mL | Freq: Once | INTRAVENOUS | Status: AC
  Administered 2023-01-29: 250 mL via INTRAVENOUS

## 2023-01-29 MED ORDER — IOHEXOL 300 MG/ML  SOLN
100.0000 mL | Freq: Once | INTRAMUSCULAR | Status: AC | PRN
  Administered 2023-01-29: 100 mL via INTRAVENOUS

## 2023-01-29 MED ORDER — DOXYCYCLINE HYCLATE 100 MG IV SOLR
100.0000 mg | Freq: Once | INTRAVENOUS | Status: AC
  Administered 2023-01-29: 100 mg via INTRAVENOUS
  Filled 2023-01-29: qty 100

## 2023-01-29 MED ORDER — IBUPROFEN 600 MG PO TABS: 600.0000 mg | ORAL_TABLET | Freq: Four times a day (QID) | ORAL | 0 refills | Status: DC | PRN

## 2023-01-29 MED ORDER — OXYCODONE HCL 5 MG PO TABS: 5.0000 mg | ORAL_TABLET | Freq: Three times a day (TID) | ORAL | 0 refills | Status: DC | PRN

## 2023-01-29 MED ORDER — KETOROLAC TROMETHAMINE 15 MG/ML IJ SOLN
15.0000 mg | Freq: Once | INTRAMUSCULAR | Status: AC
  Administered 2023-01-29: 15 mg via INTRAVENOUS
  Filled 2023-01-29: qty 1

## 2023-01-29 MED ORDER — SODIUM CHLORIDE 0.9 % IV SOLN
2.0000 g | Freq: Once | INTRAVENOUS | Status: DC
  Filled 2023-01-29: qty 20

## 2023-01-29 NOTE — ED Provider Triage Note (Signed)
 Emergency Medicine Provider Triage Evaluation Note  Cassandra [name] , a 38 y.o. female  was evaluated in triage.  Pt complains of right hand abscess for the past 4-5 days. She repoerts she was seen by UC and placed on Bactrim  but it has been worsening. Unsure if she has hand fever. Reports pain and worsening abscess in size. H/o polysubstance and IVDU, but reports she hasn't used needles in two months.  Review of Systems  Positive:  Negative:   Physical Exam  Ht 5' 2 (1.575 m)   Wt 36.1 kg   LMP 01/16/2023 (Approximate)   BMI 14.56 kg/m  Gen:   Awake, no distress   Resp:  Normal effort  MSK:   Moves extremities without difficulty  Other:  Large abscess/blister seen in between the tumb and first finger going into the volar and dorsal area with surrounding redness. Brisk cap refill present to all fingers. Palpable radial pulses.   Medical Decision Making  Medically screening exam initiated at 4:15 PM.  Appropriate orders placed.  Cherrill Scrima was informed that the remainder of the evaluation will be completed by another provider, this initial triage assessment does not replace that evaluation, and the importance of remaining in the ED until their evaluation is complete.  Code sepsis ordered with antibiotics and fluids. Patient is being roomed now.    Bernis Ernst, PA-C 01/29/23 1627

## 2023-01-29 NOTE — Discharge Instructions (Addendum)
 Please call the hand surgeon's office tomorrow morning at 8 AM, let them know that you are in the emergency department and you are asked to be seen in the office today by Dr. Arlinda.  STOP taking Bactrim  and START taking doxycycline  antibiotic tomorrow.  Please return to the emergency primary for worsening pain and redness spreading up your arm, fevers, inability to make a fist or weakness in your fingers.

## 2023-01-29 NOTE — ED Provider Notes (Signed)
 [place] EMERGENCY DEPARTMENT AT [facility] Provider Note   CSN: [id number] Arrival date & time: 01/29/23  8397     History  Chief Complaint  Patient presents with   Wound Infection    Cassandra [name] is a 38 y.o. female presenting to the emergency department with complaint of infection in her right hand.  She says she got a spider in her hand several days ago, went to urgent care has been on Bactrim  for 4 days, but feels that the pain is getting worse.  Denies fevers.  Reports prior history of polysubstance use and IV drug use but no recent use.  HPI     Home Medications Prior to Admission medications   Medication Sig Start Date End Date Taking? Authorizing Provider  doxycycline  (VIBRAMYCIN ) 100 MG capsule Take 1 capsule (100 mg total) by mouth 2 (two) times daily for 7 days. 01/30/23 02/06/23 Yes Jeselle Hiser, Donnice PARAS, MD  ibuprofen  (ADVIL ) 600 MG tablet Take 1 tablet (600 mg total) by mouth every 6 (six) hours as needed for up to 30 doses. 01/29/23  Yes Dantrell Schertzer, Donnice PARAS, MD  oxyCODONE  (ROXICODONE ) 5 MG immediate release tablet Take 1 tablet (5 mg total) by mouth every 8 (eight) hours as needed for up to 10 doses for severe pain (pain score 7-10). 01/29/23  Yes Cottie Donnice PARAS, MD  ADDERALL  XR 30 MG 24 hr capsule Take 30 mg by mouth 2 (two) times daily. 09/01/20   [provider]  baclofen (LIORESAL) 10 MG tablet Take 10 mg by mouth 3 (three) times daily as needed. 08/17/21   [provider]  buprenorphine  (SUBUTEX ) 8 MG SUBL SL tablet Place 8 mg under the tongue 2 (two) times daily. 09/09/20   [provider]  buPROPion  (WELLBUTRIN  SR) 150 MG 12 hr tablet Take 1 tablet (150 mg total) by mouth every morning. 09/21/21   Alexander, Natalie, DO  cloNIDine  (CATAPRES ) 0.2 MG tablet Take 1 tablet (0.2 mg total) by mouth daily for 2 days. 09/22/21 09/24/21  Alexander, Natalie, DO  escitalopram  (LEXAPRO ) 20 MG tablet Take 1 tablet (20 mg total) by mouth daily. 09/21/21    Alexander, Natalie, DO  etonogestrel -ethinyl estradiol  (NUVARING) 0.12-0.015 MG/24HR vaginal ring Insert vaginally and leave in place for 3 consecutive weeks, then remove for 1 week. 10/31/20   Lake Read, MD  gabapentin  (NEURONTIN ) 300 MG capsule Take 1 capsule (300 mg total) by mouth 2 (two) times daily. 09/21/21   Alexander, Natalie, DO  meloxicam  (MOBIC ) 15 MG tablet One tab PO qAM with breakfast for 2 weeks, then daily prn pain. 09/21/21   Alexander, Natalie, DO  Multiple Vitamin (MULTIVITAMIN WITH MINERALS) TABS tablet Take 1 tablet by mouth daily. 09/21/21   Alexander, Natalie, DO  naloxone  (NARCAN ) nasal spray 4 mg/0.1 mL 1 spray once. 07/13/21   [provider]  nicotine polacrilex (NICORETTE) 2 MG gum SMARTSIG:1 Each By Mouth Every 2 Hours PRN 05/08/21   [provider]  pantoprazole  (PROTONIX ) 40 MG tablet Take 1 tablet (40 mg total) by mouth daily. 09/21/21 10/21/21  Alexander, Natalie, DO  traZODone  (DESYREL ) 100 MG tablet Take 2 tablets (200 mg total) by mouth at bedtime as needed. 09/21/21   Alexander, Natalie, DO      Allergies    Metronidazole and Prednisone    Review of Systems   Review of Systems  Physical Exam Updated Vital Signs BP 105/84 (BP Location: Left Arm)   Pulse 80   Temp 98 F (36.7  C) (Oral)   Resp 14   Ht 5' 2 (1.575 m)   Wt 36.1 kg   LMP 01/16/2023 (Approximate)   SpO2 98%   BMI 14.56 kg/m  Physical Exam Constitutional:      General: She is not in acute distress. HENT:     Head: Normocephalic and atraumatic.  Eyes:     Conjunctiva/sclera: Conjunctivae normal.     Pupils: Pupils are equal, round, and reactive to light.  Cardiovascular:     Rate and Rhythm: Normal rate and regular rhythm.  Pulmonary:     Effort: Pulmonary effort is normal. No respiratory distress.  Abdominal:     General: There is no distension.     Tenderness: There is no abdominal tenderness.  Skin:    General: Skin is warm and dry.     Comments:  Blistering wound with erythema of the right hand, predominantly dorsum, likely infected bullae, surrounding erythema, patient is able to fully flex and extend fingers  Neurological:     General: No focal deficit present.     Mental Status: She is alert. Mental status is at baseline.  Psychiatric:        Mood and Affect: Mood normal.        Behavior: Behavior normal.     ED Results / Procedures / Treatments   Labs (all labs ordered are listed, but only abnormal results are displayed) Labs Reviewed  COMPREHENSIVE METABOLIC PANEL - Abnormal; Notable for the following components:      Result Value   Sodium 134 (*)    CO2 21 (*)    Calcium 8.7 (*)    AST 12 (*)    All other components within normal limits  CBC WITH DIFFERENTIAL/PLATELET - Abnormal; Notable for the following components:   WBC 13.0 (*)    Platelets 496 (*)    Neutro Abs 11.1 (*)    All other components within normal limits  RESP PANEL BY RT-PCR (RSV, FLU A&B, COVID)  RVPGX2  CULTURE, BLOOD (ROUTINE X 2)  CULTURE, BLOOD (ROUTINE X 2)  HCG, SERUM, QUALITATIVE  URINALYSIS, W/ REFLEX TO CULTURE (INFECTION SUSPECTED)  I-STAT CG4 LACTIC ACID, ED    EKG EKG Interpretation Date/Time:  Tuesday January 29 2023 16:30:48 EST Ventricular Rate:  97 PR Interval:  105 QRS Duration:  79 QT Interval:  342 QTC Calculation: 435 R Axis:   81  Text Interpretation: Sinus rhythm Short PR interval Confirmed by Cottie Cough [id number]) on 01/29/2023 4:40:20 PM  Radiology CT HAND RIGHT W CONTRAST Result Date: 01/29/2023 CLINICAL DATA:  Soft tissue infection suspected, hand, no prior imaging Pain and swelling for 4 days. Patient reports having splinter in her hand. EXAM: CT OF THE UPPER RIGHT EXTREMITY WITH CONTRAST TECHNIQUE: Multidetector CT imaging of the upper right extremity was performed according to the standard protocol following intravenous contrast administration. RADIATION DOSE REDUCTION: This exam was performed according to  the departmental dose-optimization program which includes automated exposure control, adjustment of the mA and/or kV according to patient size and/or use of iterative reconstruction technique. CONTRAST:  OMNIPAQUE  IOHEXOL  300 MG/ML  SOLN COMPARISON:  Radiograph 01/25/2023 FINDINGS: Bones/Joint/Cartilage No fracture. Normal alignment, no dislocation. The joint spaces are normal. No erosions or bone destruction. Ligaments Suboptimally assessed by CT. Muscles and Tendons No intramuscular collection.  No gross tenosynovial fluid by CT. Soft tissues Peripherally enhancing fluid collection at the first-second interspace measuring approximately 1.8 x 2.1 x 1.8 cm, series 9, image 56 and series 11,  image 35. No internal soft tissue gas. No radiopaque foreign body. Suspect overlying complex blister in this region, 9 mm in thickness. Generalized subcutaneous edema over the dorsum of the hand. IMPRESSION: 1. Peripherally enhancing fluid collection at the 1st-2nd interspace measuring 2.1 cm suspicious for abscess. 2. Generalized subcutaneous edema. 3. No CT findings of osteomyelitis. Electronically Signed   By: Andrea Gasman M.D.   On: 01/29/2023 19:49    Procedures .Incision and Drainage  Date/Time: 01/29/2023 11:21 PM  Performed by: Cottie Donnice PARAS, MD Authorized by: Cottie Donnice PARAS, MD   Consent:    Consent obtained:  Verbal   Consent given by:  Patient   Risks discussed:  Bleeding, damage to other organs, incomplete drainage, infection and pain   Alternatives discussed:  Referral Universal protocol:    Procedure explained and questions answered to patient or proxy's satisfaction: yes     Relevant documents present and verified: yes     Test results available : yes     Imaging studies available: yes     Required blood products, implants, devices, and special equipment available: yes     Site/side marked: yes     Immediately prior to procedure, a time out was called: yes     Patient identity  confirmed:  Arm band Location:    Type:  Abscess   Size:  2 cm   Location:  Upper extremity   Upper extremity location:  Hand   Hand location:  R hand Pre-procedure details:    Skin preparation:  Povidone-iodine Sedation:    Sedation type:  Anxiolysis Anesthesia:    Anesthesia method:  Local infiltration   Local anesthetic:  Lidocaine  1% w/o epi Procedure type:    Complexity:  Simple Procedure details:    Ultrasound guidance: yes     Needle aspiration: yes     Needle size:  20 G   Incision types:  Stab incision   Wound management:  Probed and deloculated   Drainage:  Purulent   Drainage amount:  Moderate   Wound treatment:  Wound left open   Packing materials:  None Post-procedure details:    Procedure completion:  Tolerated well, no immediate complications     Medications Ordered in ED Medications  lactated ringers  bolus 1,000 mL (0 mLs Intravenous Stopped 01/29/23 1953)    And  lactated ringers  bolus 250 mL (0 mLs Intravenous Stopped 01/29/23 1737)  morphine  (PF) 4 MG/ML injection 4 mg (4 mg Intravenous Given 01/29/23 1733)  ketorolac  (TORADOL ) 15 MG/ML injection 15 mg (15 mg Intravenous Given 01/29/23 1733)  doxycycline  (VIBRAMYCIN ) 100 mg in dextrose  5 % 250 mL IVPB (0 mg Intravenous Stopped 01/29/23 2015)  iohexol  (OMNIPAQUE ) 300 MG/ML solution 100 mL (100 mLs Intravenous Contrast Given 01/29/23 1837)  lidocaine  (PF) (XYLOCAINE ) 1 % injection 10 mL (10 mLs Other Given by Other 01/29/23 2142)  fentaNYL  (SUBLIMAZE ) injection 75 mcg (75 mcg Intravenous Given 01/29/23 2056)  oxyCODONE -acetaminophen  (PERCOCET/ROXICET) 5-325 MG per tablet 1 tablet (1 tablet Oral Given 01/29/23 2237)    ED Course/ Medical Decision Making/ A&P Clinical Course as of 01/29/23 2323  Tue Jan 29, 2023  2218 Paged dr agarwala through office [facility] [MT]  2239 Dr Arlinda okay to start antibiotics, he can see pt tomorrow in office in AM.  Patient informed in agreement with plan.  She will contact  the office in the morning. [MT]  2242 Did discuss with the patient risks and benefits of short course of opioid prescription.  She has been on Suboxone  in the past but not currently.  Ultimately she has opted for a short course of opioids, which I think are reasonably warranted given the amount of pain and discomfort she has with this lesion. [MT]    Clinical Course User Index [MT] Kimberlie Csaszar, Donnice PARAS, MD                                 Medical Decision Making Amount and/or Complexity of Data Reviewed Labs: ordered. Radiology: ordered.  Risk Prescription drug management.   This patient presents to the ED with concern for hand infection, likely infected bullae. This involves an extensive number of treatment options, and is a complaint that carries with it a high risk of complications and morbidity.    Co-morbidities that complicate the patient evaluation: History of IV drug use, high risk for potential MRSA infection  I ordered and personally interpreted labs.  The pertinent results include: White blood cell count 13.0.  Lactate within normal limits.  CMP unremarkable.  I ordered imaging studies including CT imaging of the hand, to evaluate for underlying abscess or retained foreign body I independently visualized and interpreted imaging which showed abscess of bullae involving the right hand I agree with the radiologist interpretation  The patient was maintained on a cardiac monitor.  I personally viewed and interpreted the cardiac monitored which showed an underlying rhythm of: Sinus rhythm  Per my interpretation the patient's ECG shows no acute ischemic findings  I ordered medication including IV doxycycline  antibiotic for infection, IV fluid bolus, IV pain medications and oral pain medicine  I have reviewed the patients home medicines and have made adjustments as needed  The case was discussed with the orthopedic hand surgeon on-call by phone, please see ED course  After  consenting the patient and performed a bedside debridement of her infected bullae, carefully counting away the dead skin.  Subsequently I was able to perform a partial needle aspiration and then simple single straight incision into the abscess, draining approximately 1 to 2 cc of fluid from the abscess pocket.  After the interventions noted above, I reevaluated the patient and found that they have: improved -following debridement and I&D at the bedside the patient reported improvement of the tension in her hand and ability to have better mobility of her fingers  I felt was reasonable for her to pursue outpatient follow-up.  My suspicion for sepsis or bacteremia is quite low.  The hand surgeon can see her closely in the clinic tomorrow.  I emphasized the patient the need to follow-up with a specialist, given the high risk of infection spreading without close monitoring.  Dispostion:  After consideration of the diagnostic results and the patients response to treatment, I feel that the patent would benefit from very close outpatient follow-up.         Final Clinical Impression(s) / ED Diagnoses Final diagnoses:  Cellulitis, unspecified cellulitis site  Abscess    Rx / DC Orders ED Discharge Orders          Ordered    doxycycline  (VIBRAMYCIN ) 100 MG capsule  2 times daily        01/29/23 2241    ibuprofen  (ADVIL ) 600 MG tablet  Every 6 hours PRN        01/29/23 2241    oxyCODONE  (ROXICODONE ) 5 MG immediate release tablet  Every 8 hours PRN  01/29/23 2241              Cottie Donnice PARAS, MD 01/29/23 [phone]

## 2023-01-29 NOTE — ED Triage Notes (Signed)
 Pt reports having right hand pain and swelling x 4 days. Pt states that she initially had a splinter in her hand.

## 2023-01-31 ENCOUNTER — Ambulatory Visit: Payer: 59 | Admitting: Physician Assistant

## 2023-02-03 LAB — CULTURE, BLOOD (ROUTINE X 2): Culture: NO GROWTH

## 2023-02-04 ENCOUNTER — Ambulatory Visit: Payer: 59 | Admitting: Orthopedic Surgery

## 2023-11-18 ENCOUNTER — Encounter: Payer: Self-pay | Admitting: Radiology

## 2023-12-22 ENCOUNTER — Emergency Department (HOSPITAL_COMMUNITY)

## 2023-12-22 ENCOUNTER — Observation Stay (HOSPITAL_COMMUNITY)
Admission: EM | Admit: 2023-12-22 | Discharge: 2023-12-22 | Disposition: A | Discharge status: Home / Self Care | Attending: Internal Medicine | Admitting: Internal Medicine

## 2023-12-22 ENCOUNTER — Encounter (HOSPITAL_COMMUNITY): Payer: Self-pay | Admitting: Internal Medicine

## 2023-12-22 ENCOUNTER — Other Ambulatory Visit: Payer: Self-pay

## 2023-12-22 DIAGNOSIS — L03115 Cellulitis of right lower limb: Secondary | ICD-10-CM

## 2023-12-22 DIAGNOSIS — M71161 Other infective bursitis, right knee: Secondary | ICD-10-CM

## 2023-12-22 DIAGNOSIS — Z79899 Other long term (current) drug therapy: Secondary | ICD-10-CM | POA: Diagnosis not present

## 2023-12-22 DIAGNOSIS — Z7901 Long term (current) use of anticoagulants: Secondary | ICD-10-CM | POA: Diagnosis not present

## 2023-12-22 DIAGNOSIS — M25561 Pain in right knee: Secondary | ICD-10-CM | POA: Diagnosis present

## 2023-12-22 DIAGNOSIS — Y9389 Activity, other specified: Secondary | ICD-10-CM | POA: Diagnosis not present

## 2023-12-22 DIAGNOSIS — M7041 Prepatellar bursitis, right knee: Secondary | ICD-10-CM | POA: Diagnosis not present

## 2023-12-22 DIAGNOSIS — Z1152 Encounter for screening for COVID-19: Secondary | ICD-10-CM | POA: Diagnosis not present

## 2023-12-22 DIAGNOSIS — F17210 Nicotine dependence, cigarettes, uncomplicated: Secondary | ICD-10-CM | POA: Diagnosis not present

## 2023-12-22 LAB — COMPREHENSIVE METABOLIC PANEL WITH GFR
ALT: 10 U/L (ref 0–44)
AST: 19 U/L (ref 15–41)
Albumin: 4.2 g/dL (ref 3.5–5.0)
Alkaline Phosphatase: 71 U/L (ref 38–126)
Anion gap: 11 (ref 5–15)
BUN: 10 mg/dL (ref 6–20)
CO2: 25 mmol/L (ref 22–32)
Calcium: 9.3 mg/dL (ref 8.9–10.3)
Chloride: 106 mmol/L (ref 98–111)
Creatinine, Ser: 0.53 mg/dL (ref 0.44–1.00)
GFR, Estimated: >60 mL/min (ref >60)
Glucose, Bld: 92 mg/dL (ref 70–99)
Potassium: 3.4 mmol/L — ABNORMAL LOW (ref 3.5–5.1)
Sodium: 141 mmol/L (ref 135–145)
Total Bilirubin: 0.2 mg/dL (ref 0.0–1.2)
Total Protein: 7.2 g/dL (ref 6.5–8.1)

## 2023-12-22 LAB — CBC WITH DIFFERENTIAL/PLATELET
Abs Immature Granulocytes: 0.04 K/uL (ref 0.00–0.07)
Basophils Absolute: 0.1 K/uL (ref 0.0–0.1)
Basophils Relative: 1 %
Eosinophils Absolute: 0.2 K/uL (ref 0.0–0.5)
Eosinophils Relative: 1 %
HCT: 40.2 % (ref 36.0–46.0)
Hemoglobin: 12.9 g/dL (ref 12.0–15.0)
Immature Granulocytes: 0 %
Lymphocytes Relative: 15 %
Lymphs Abs: 1.8 K/uL (ref 0.7–4.0)
MCH: 27.8 pg (ref 26.0–34.0)
MCHC: 32.1 g/dL (ref 30.0–36.0)
MCV: 86.6 fL (ref 80.0–100.0)
Monocytes Absolute: 1.1 K/uL — ABNORMAL HIGH (ref 0.1–1.0)
Monocytes Relative: 9 %
Neutro Abs: 8.7 K/uL — ABNORMAL HIGH (ref 1.7–7.7)
Neutrophils Relative %: 74 %
Platelets: 420 K/uL — ABNORMAL HIGH (ref 150–400)
RBC: 4.64 MIL/uL (ref 3.87–5.11)
RDW: 13.4 % (ref 11.5–15.5)
WBC: 11.9 K/uL — ABNORMAL HIGH (ref 4.0–10.5)
nRBC: 0 % (ref 0.0–0.2)

## 2023-12-22 LAB — I-STAT CG4 LACTIC ACID, ED: Lactic Acid, Venous: 1.6 mmol/L (ref 0.5–1.9)

## 2023-12-22 LAB — PROTIME-INR
INR: 1 (ref 0.8–1.2)
Prothrombin Time: 13.6 s (ref 11.4–15.2)

## 2023-12-22 LAB — HCG, SERUM, QUALITATIVE: Preg, Serum: NEGATIVE

## 2023-12-22 LAB — RESP PANEL BY RT-PCR (RSV, FLU A&B, COVID)  RVPGX2
Influenza A by PCR: NEGATIVE
Influenza B by PCR: NEGATIVE
Resp Syncytial Virus by PCR: NEGATIVE
SARS Coronavirus 2 by RT PCR: NEGATIVE

## 2023-12-22 MED ORDER — SODIUM CHLORIDE 0.9 % IV SOLN
2.0000 g | Freq: Once | INTRAVENOUS | Status: AC
  Administered 2023-12-22: 2 g via INTRAVENOUS
  Filled 2023-12-22: qty 20

## 2023-12-22 MED ORDER — ACETAMINOPHEN 650 MG RE SUPP: 650.0000 mg | Freq: Four times a day (QID) | RECTAL | Status: DC | PRN

## 2023-12-22 MED ORDER — VANCOMYCIN HCL IN DEXTROSE 1-5 GM/200ML-% IV SOLN: 1000.0000 mg | Freq: Once | INTRAVENOUS | Status: DC

## 2023-12-22 MED ORDER — LACTATED RINGERS IV SOLN: INTRAVENOUS | Status: DC

## 2023-12-22 MED ORDER — TRAZODONE HCL 50 MG PO TABS: 25.0000 mg | ORAL_TABLET | Freq: Every evening | ORAL | Status: DC | PRN

## 2023-12-22 MED ORDER — ONDANSETRON HCL 4 MG PO TABS
4.0000 mg | ORAL_TABLET | Freq: Four times a day (QID) | ORAL | Status: DC | PRN
  Administered 2023-12-22: 4 mg via ORAL
  Filled 2023-12-22: qty 1

## 2023-12-22 MED ORDER — SODIUM CHLORIDE 0.9 % IV SOLN: 1.0000 g | Freq: Every day | INTRAVENOUS | Status: DC

## 2023-12-22 MED ORDER — ACETAMINOPHEN 325 MG PO TABS: 650.0000 mg | ORAL_TABLET | Freq: Four times a day (QID) | ORAL | Status: DC | PRN

## 2023-12-22 MED ORDER — ALBUTEROL SULFATE (2.5 MG/3ML) 0.083% IN NEBU: 2.5000 mg | INHALATION_SOLUTION | RESPIRATORY_TRACT | Status: DC | PRN

## 2023-12-22 MED ORDER — VANCOMYCIN HCL IN DEXTROSE 750-5 MG/150ML-% IV SOLN
750.0000 mg | Freq: Once | INTRAVENOUS | Status: DC
  Filled 2023-12-22: qty 150

## 2023-12-22 MED ORDER — OXYCODONE-ACETAMINOPHEN 5-325 MG PO TABS
1.0000 | ORAL_TABLET | Freq: Once | ORAL | Status: AC
  Administered 2023-12-22: 1 via ORAL
  Filled 2023-12-22: qty 1

## 2023-12-22 MED ORDER — OXYCODONE HCL 5 MG PO TABS
5.0000 mg | ORAL_TABLET | ORAL | Status: DC | PRN
  Administered 2023-12-22 (×2): 5 mg via ORAL
  Filled 2023-12-22 (×2): qty 1

## 2023-12-22 MED ORDER — ENOXAPARIN SODIUM 30 MG/0.3ML IJ SOSY
30.0000 mg | PREFILLED_SYRINGE | INTRAMUSCULAR | Status: DC
  Filled 2023-12-22: qty 0.3

## 2023-12-22 MED ORDER — VANCOMYCIN HCL IN DEXTROSE 750-5 MG/150ML-% IV SOLN: 750.0000 mg | Freq: Every day | INTRAVENOUS | Status: DC

## 2023-12-22 MED ORDER — SODIUM CHLORIDE 0.9 % IV SOLN: 2.0000 g | Freq: Every day | INTRAVENOUS | Status: DC

## 2023-12-22 MED ORDER — SODIUM CHLORIDE 0.9 % IV BOLUS
1000.0000 mL | Freq: Once | INTRAVENOUS | Status: AC
  Administered 2023-12-22: 1000 mL via INTRAVENOUS

## 2023-12-22 MED ORDER — VANCOMYCIN HCL IN DEXTROSE 1-5 GM/200ML-% IV SOLN
1000.0000 mg | Freq: Once | INTRAVENOUS | Status: AC
  Administered 2023-12-22: 1000 mg via INTRAVENOUS
  Filled 2023-12-22: qty 200

## 2023-12-22 MED ORDER — NICOTINE 14 MG/24HR TD PT24
14.0000 mg | MEDICATED_PATCH | Freq: Every day | TRANSDERMAL | Status: DC
  Administered 2023-12-22: 14 mg via TRANSDERMAL
  Filled 2023-12-22: qty 1

## 2023-12-22 MED ORDER — ONDANSETRON HCL 4 MG/2ML IJ SOLN: 4.0000 mg | Freq: Four times a day (QID) | INTRAMUSCULAR | Status: DC | PRN

## 2023-12-22 NOTE — Progress Notes (Signed)
                                                  Against Medical Advice Patient at this time expresses desire to leave the Hospital immediately, patient has been warned that this is not Medically advisable at this time, and can result in Medical complications like Death and Disability, patient understands and accepts the risks involved and assumes full responsibilty of this decision.  This patient has also been advised that if they feel the need for further medical assistance to return to any available ER or dial [phone].  Informed by Nursing staff that this patient has left care and has signed the form  Against Medical Advice on 12/22/2023 at 2255 hrs  Lynwood Kipper BSN MSNA MSN ACNPC-AG Acute Care Nurse Practitioner [facility]

## 2023-12-22 NOTE — Plan of Care (Signed)
 Patients inpatient education progressing

## 2023-12-22 NOTE — ED Notes (Signed)
 Marked patient's knee swelling with marker. Started marking swelling and notice Patient's knee was more swollen that initially thought. Disregard first marking.

## 2023-12-22 NOTE — ED Provider Notes (Signed)
 [place] EMERGENCY DEPARTMENT AT [facility] Provider Note   CSN: [id number] Arrival date & time: 12/22/23  9378     Patient presents with: Knee Pain   Cassandra [name] is a 38 y.o. female.   HPI     38 year old female comes in with chief complaint of right knee pain.  Patient states that a week ago, she fell down, and had a scab.  2 days ago she fell again, this time hard and over time the knee has become swollen and red.  She is able to bear weight, but with discomfort.  She also started having some subjective fevers, prompting her to come to the ER.  Patient has history of Ehlers-Danlos syndrome.  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  She states that she has had recurrent infection and abscess in the past.  Prior to Admission medications   Medication Sig Start Date End Date Taking? Authorizing Provider  ADDERALL  XR 30 MG 24 hr capsule Take 30 mg by mouth 2 (two) times daily. 09/01/20   [provider]  baclofen (LIORESAL) 10 MG tablet Take 10 mg by mouth 3 (three) times daily as needed. 08/17/21   [provider]  buprenorphine  (SUBUTEX ) 8 MG SUBL SL tablet Place 8 mg under the tongue 2 (two) times daily. 09/09/20   [provider]  buPROPion  (WELLBUTRIN  SR) 150 MG 12 hr tablet Take 1 tablet (150 mg total) by mouth every morning. 09/21/21   Alexander, Natalie, DO  cloNIDine  (CATAPRES ) 0.2 MG tablet Take 1 tablet (0.2 mg total) by mouth daily for 2 days. 09/22/21 09/24/21  Alexander, Natalie, DO  escitalopram  (LEXAPRO ) 20 MG tablet Take 1 tablet (20 mg total) by mouth daily. 09/21/21   Alexander, Natalie, DO  etonogestrel -ethinyl estradiol  (NUVARING) 0.12-0.015 MG/24HR vaginal ring Insert vaginally and leave in place for 3 consecutive weeks, then remove for 1 week. 10/31/20   Lake Read, MD  gabapentin  (NEURONTIN ) 300 MG capsule Take 1 capsule (300 mg total) by mouth 2 (two) times daily.  09/21/21   Alexander, Natalie, DO  ibuprofen  (ADVIL ) 600 MG tablet Take 1 tablet (600 mg total) by mouth every 6 (six) hours as needed for up to 30 doses. 01/29/23   Cottie Donnice PARAS, MD  meloxicam  (MOBIC ) 15 MG tablet One tab PO qAM with breakfast for 2 weeks, then daily prn pain. 09/21/21   Alexander, Natalie, DO  Multiple Vitamin (MULTIVITAMIN WITH MINERALS) TABS tablet Take 1 tablet by mouth daily. 09/21/21   Alexander, Natalie, DO  naloxone  (NARCAN ) nasal spray 4 mg/0.1 mL 1 spray once. 07/13/21   [provider]  nicotine  polacrilex (NICORETTE) 2 MG gum SMARTSIG:1 Each By Mouth Every 2 Hours PRN 05/08/21   [provider]  oxyCODONE  (ROXICODONE ) 5 MG immediate release tablet Take 1 tablet (5 mg total) by mouth every 8 (eight) hours as needed for up to 10 doses for severe pain (pain score 7-10). 01/29/23   Cottie Donnice PARAS, MD  pantoprazole  (PROTONIX ) 40 MG tablet Take 1 tablet (40 mg total) by mouth daily. 09/21/21 10/21/21  Alexander, Natalie, DO  traZODone  (DESYREL ) 100 MG tablet Take 2 tablets (200 mg total) by mouth at bedtime as needed. 09/21/21   Alexander, Natalie, DO    Allergies: Metronidazole and Prednisone    Review of Systems  All other systems reviewed and are negative.   Updated Vital Signs BP 114/71 (BP Location: Left Arm)   Pulse 99  Temp 98 F (36.7 C) (Oral)   Resp 18   Ht 5' 2 (1.575 m)   Wt 38.6 kg   LMP 12/16/2023   SpO2 99%   BMI 15.55 kg/m   Physical Exam Vitals and nursing note reviewed.  Constitutional:      Appearance: She is well-developed.  HENT:     Head: Atraumatic.  Cardiovascular:     Rate and Rhythm: Normal rate.  Pulmonary:     Effort: Pulmonary effort is normal.  Musculoskeletal:        General: Swelling and tenderness present.     Cervical back: Normal range of motion and neck supple.     Comments: Patient has erythema of the right knee, patient able to flex and extend the knee with slight compromise in range of motion  because of swelling and pain.  Patient has a 4 cm area of fluctuant area overlying the patella.   Skin:    General: Skin is warm and dry.     Findings: Erythema present.  Neurological:     Mental Status: She is alert and oriented to person, place, and time.     (all labs ordered are listed, but only abnormal results are displayed) Labs Reviewed  COMPREHENSIVE METABOLIC PANEL WITH GFR - Abnormal; Notable for the following components:      Result Value   Potassium 3.4 (*)    All other components within normal limits  CBC WITH DIFFERENTIAL/PLATELET - Abnormal; Notable for the following components:   WBC 11.9 (*)    Platelets 420 (*)    Neutro Abs 8.7 (*)    Monocytes Absolute 1.1 (*)    All other components within normal limits  RESP PANEL BY RT-PCR (RSV, FLU A&B, COVID)  RVPGX2  CULTURE, BLOOD (ROUTINE X 2)  CULTURE, BLOOD (ROUTINE X 2)  PROTIME-INR  HCG, SERUM, QUALITATIVE  URINE DRUG SCREEN  I-STAT CG4 LACTIC ACID, ED  I-STAT CG4 LACTIC ACID, ED    EKG: None  Radiology: US  RT LOWER EXTREM LTD SOFT TISSUE NON VASCULAR Result Date: 12/22/2023 EXAM: US  right Lower Extremity Nonvascular Soft Tissue Ultrasound TECHNIQUE: Real-time ultrasound scan of the anterior right knee soft tissues with image documentation. COMPARISON: None available. CLINICAL HISTORY: Swelling. Evaluate for bursitis. FINDINGS: SOFT TISSUES: There is subcutaneous soft tissue edema overlying the right knee. Below the subcutaneous fat there is a complex, hypoechoic fluid collection measuring 4.2 x 4.2 x 0.6 cm. IMPRESSION: 1. Complex hypoechoic fluid collection measuring 4.2 x 4.2 x 0.6 cm deep to the subcutaneous fat in the anterior right knee soft tissues, compatible prepatellar bursitis. Differential considerations include hematoma or abscess. 2. Edema involving the prepatellar superficial subcutaneous soft tissues. Correlate for any clinical signs or symptoms of cellulitis. Electronically signed by: Waddell Calk MD 12/22/2023 08:37 AM EST RP Workstation: [id number]   DG Chest Port 1 View Result Date: 12/22/2023 EXAM: 1 VIEW(S) XRAY OF THE CHEST 12/22/2023 07:55:55 AM COMPARISON: None available. CLINICAL HISTORY: Questionable sepsis - evaluate for abnormality FINDINGS: LUNGS AND PLEURA: No focal pulmonary opacity. No pleural effusion. No pneumothorax. HEART AND MEDIASTINUM: No acute abnormality of the cardiac and mediastinal silhouettes. BONES AND SOFT TISSUES: No acute osseous abnormality. IMPRESSION: 1. No acute cardiopulmonary process identified. Electronically signed by: Camellia Candle MD 12/22/2023 07:58 AM EST RP Workstation: [id number]   DG Knee Complete 4 Views Right Result Date: 12/22/2023 EXAM: 4 OR MORE VIEW(S) Xray of the right knee 12/22/2023 07:16:00 AM COMPARISON: None available. CLINICAL HISTORY: Fall Fall FINDINGS:  BONES AND JOINTS: No acute fracture. No malalignment. No significant joint effusion. SOFT TISSUES: The soft tissues are unremarkable. IMPRESSION: 1. Anterior knee soft tissue swelling. Imaging features compatible with prepatellar bursitis. 2. No acute fracture or malalignment. 3. No significant joint effusion. Electronically signed by: Camellia Candle MD 12/22/2023 07:53 AM EST RP Workstation: [id number]     Procedures   Medications Ordered in the ED  lactated ringers  infusion (has no administration in time range)  vancomycin  (VANCOCIN ) IVPB 1000 mg/200 mL premix (1,000 mg Intravenous New Bag/Given 12/22/23 0932)  cefTRIAXone  (ROCEPHIN ) 2 g in sodium chloride  0.9 % 100 mL IVPB (0 g Intravenous Stopped 12/22/23 0931)  sodium chloride  0.9 % bolus 1,000 mL (1,000 mLs Intravenous New Bag/Given 12/22/23 0818)  oxyCODONE -acetaminophen  (PERCOCET/ROXICET) 5-325 MG per tablet 1 tablet (1 tablet Oral Given 12/22/23 0755)                                    Medical Decision Making Amount and/or Complexity of Data Reviewed Labs: ordered. Radiology: ordered.  Risk Prescription drug  management. Decision regarding hospitalization.   38 year old patient comes in with chief complaint of right leg pain and swelling.  She had a fall on 2 separate occasions, injuring the same knee.  After the last fall, 2 days ago she started developing significant swelling and pain.  This morning she had fevers, which prompted her to come to the ER.  X-ray of the knee was ordered, independently interpreted by me.  There is no evidence of fracture.  Based on my exam, differential diagnosis includes septic bursitis, cellulitis, soft tissue abscess.  From timeline perspective, abscess less likely if the symptoms just came about in 2 days after the fall, and bursitis is higher in the differential diagnosis.  I think patient has cellulitis for sure.  I ordered an ultrasound, and it reveals what appears to be possibly bursitis, but hematoma and abscess are also in the differential.  I discussed with the patient that clinical suspicion is high for bursitis, but abscess cannot be ruled out.  We can be aggressive and proceed with needle aspiration at this time to get more definitive answer.  Patient states that she is not wanting any intervention.  She has been applying warm soaks, and there is not been any purulent drainage from her scab site, making her feel that this is unlikely to be an abscess, more likely to be bursitis.  She prefers to wait and watch approach.  Given the cellulitis, I have requested admission to the hospital. I will request hospitalist to consult on orthopedics if needed.  Final diagnoses:  Prepatellar bursitis of right knee  Cellulitis of right lower extremity    ED Discharge Orders     None          Charlyn Sora, MD 12/22/23 [id number]

## 2023-12-22 NOTE — ED Triage Notes (Signed)
 Patient brought in by POV with c/o falling on right knee 1 week ago and then 2 days ago and now her right knee is swollen red and warm to touch. Patient states she is unable to put any weigh on her leg.

## 2023-12-22 NOTE — ED Notes (Signed)
Patient states she cannot urinate at this time

## 2023-12-22 NOTE — Sepsis Progress Note (Signed)
 Sepsis protocol is being followed by [facility].

## 2023-12-22 NOTE — H&P (Signed)
 History and Physical  Cassandra [name] FMW:[id number] DOB: [birth year] DOA: 12/22/2023  PCP: Patient, No Pcp Per   Chief Complaint: Right knee pain and swelling  HPI: Cassandra [name] is a 38 y.o. female with medical history significant for ADHD, anxiety, Ehlers-Danlos readmitted to the hospital with right knee prepatellar bursitis.  History is provided by the patient, says that she is clumsy and frequently falls due to her history of Ehlers-Danlos.  She fell a couple of days ago onto her right knee, developed painful redness and swelling of the right knee.  Today she developed some subjective fever and chills.  She denies any chest pain, shortness of breath, or any other acute concerns.  Review of Systems: Please see HPI for pertinent positives and negatives. A complete 10 system review of systems are otherwise negative.  Past Medical History:  Diagnosis Date   ADHD (attention deficit hyperactivity disorder)    Anxiety    Asthma    Kidney stone    Past Surgical History:  Procedure Laterality Date   ELBOW FRACTURE SURGERY     EYE MUSCLE SURGERY Bilateral    OVARIAN CYST REMOVAL Right    TONSILLECTOMY     Social History:  reports that she has been smoking cigarettes. She has never used smokeless tobacco. She reports that she does not currently use alcohol. She reports that she does not currently use drugs.  Allergies  Allergen Reactions   Metronidazole Nausea And Vomiting   Prednisone Other (See Comments)    irritable/ angry and very lethargic when finishes    No family history on file.   Prior to Admission medications   Medication Sig Start Date End Date Taking? Authorizing Provider  ADDERALL  XR 30 MG 24 hr capsule Take 30 mg by mouth 2 (two) times daily. 09/01/20   [provider]  baclofen (LIORESAL) 10 MG tablet Take 10 mg by mouth 3 (three) times daily as needed. 08/17/21   [provider]  buprenorphine  (SUBUTEX ) 8 MG SUBL SL tablet Place 8 mg under the tongue 2  (two) times daily. 09/09/20   [provider]  buPROPion  (WELLBUTRIN  SR) 150 MG 12 hr tablet Take 1 tablet (150 mg total) by mouth every morning. 09/21/21   Alexander, Natalie, DO  cloNIDine  (CATAPRES ) 0.2 MG tablet Take 1 tablet (0.2 mg total) by mouth daily for 2 days. 09/22/21 09/24/21  Alexander, Natalie, DO  escitalopram  (LEXAPRO ) 20 MG tablet Take 1 tablet (20 mg total) by mouth daily. 09/21/21   Alexander, Natalie, DO  etonogestrel -ethinyl estradiol  (NUVARING) 0.12-0.015 MG/24HR vaginal ring Insert vaginally and leave in place for 3 consecutive weeks, then remove for 1 week. 10/31/20   Lake Read, MD  gabapentin  (NEURONTIN ) 300 MG capsule Take 1 capsule (300 mg total) by mouth 2 (two) times daily. 09/21/21   Alexander, Natalie, DO  ibuprofen  (ADVIL ) 600 MG tablet Take 1 tablet (600 mg total) by mouth every 6 (six) hours as needed for up to 30 doses. 01/29/23   Cottie Donnice PARAS, MD  meloxicam  (MOBIC ) 15 MG tablet One tab PO qAM with breakfast for 2 weeks, then daily prn pain. 09/21/21   Alexander, Natalie, DO  Multiple Vitamin (MULTIVITAMIN WITH MINERALS) TABS tablet Take 1 tablet by mouth daily. 09/21/21   Alexander, Natalie, DO  naloxone  (NARCAN ) nasal spray 4 mg/0.1 mL 1 spray once. 07/13/21   [provider]  nicotine  polacrilex (NICORETTE) 2 MG gum SMARTSIG:1 Each By Mouth Every 2 Hours PRN 05/08/21   [provider]  oxyCODONE  (ROXICODONE ) 5 MG immediate release tablet Take 1 tablet (5 mg total) by mouth every 8 (eight) hours as needed for up to 10 doses for severe pain (pain score 7-10). 01/29/23   Cottie Donnice PARAS, MD  pantoprazole  (PROTONIX ) 40 MG tablet Take 1 tablet (40 mg total) by mouth daily. 09/21/21 10/21/21  Alexander, Natalie, DO  traZODone  (DESYREL ) 100 MG tablet Take 2 tablets (200 mg total) by mouth at bedtime as needed. 09/21/21   Marsa Edelman, DO    Physical Exam: BP 114/71 (BP Location: Left Arm)   Pulse 99   Temp 98 F (36.7 C) (Oral)   Resp 18    Ht 5' 2 (1.575 m)   Wt 38.6 kg   LMP 12/16/2023   SpO2 99%   BMI 15.55 kg/m  General:  Alert, oriented, calm, in no acute distress, looks comfortable and nontoxic Cardiovascular: RRR, no murmurs or rubs, no peripheral edema  Respiratory: clear to auscultation bilaterally, no wheezes, no crackles  Abdomen: soft, nontender, nondistended, normal bowel tones heard  Skin: dry, no rashes  Musculoskeletal: Obvious cellulitis, swelling and tenderness over the right knee, range of motion severely limited due to pain  Psychiatric: appropriate affect, normal speech  Neurologic: extraocular muscles intact, clear speech, moving all extremities with intact sensorium         Labs on Admission:  Basic Metabolic Panel: Recent Labs  Lab 12/22/23 0823  NA 141  K 3.4*  CL 106  CO2 25  GLUCOSE 92  BUN 10  CREATININE 0.53  CALCIUM 9.3   Liver Function Tests: Recent Labs  Lab 12/22/23 0823  AST 19  ALT 10  ALKPHOS 71  BILITOT 0.2  PROT 7.2  ALBUMIN  4.2   No results for input(s): LIPASE, AMYLASE in the last 168 hours. No results for input(s): AMMONIA in the last 168 hours. CBC: Recent Labs  Lab 12/22/23 0823  WBC 11.9*  NEUTROABS 8.7*  HGB 12.9  HCT 40.2  MCV 86.6  PLT 420*   Cardiac Enzymes: No results for input(s): CKTOTAL, CKMB, CKMBINDEX, TROPONINI in the last 168 hours. BNP (last 3 results) No results for input(s): BNP in the last 8760 hours.  ProBNP (last 3 results) No results for input(s): PROBNP in the last 8760 hours.  CBG: No results for input(s): GLUCAP in the last 168 hours.  Radiological Exams on Admission: US  RT LOWER EXTREM LTD SOFT TISSUE NON VASCULAR Result Date: 12/22/2023 EXAM: US  right Lower Extremity Nonvascular Soft Tissue Ultrasound TECHNIQUE: Real-time ultrasound scan of the anterior right knee soft tissues with image documentation. COMPARISON: None available. CLINICAL HISTORY: Swelling. Evaluate for bursitis. FINDINGS: SOFT  TISSUES: There is subcutaneous soft tissue edema overlying the right knee. Below the subcutaneous fat there is a complex, hypoechoic fluid collection measuring 4.2 x 4.2 x 0.6 cm. IMPRESSION: 1. Complex hypoechoic fluid collection measuring 4.2 x 4.2 x 0.6 cm deep to the subcutaneous fat in the anterior right knee soft tissues, compatible prepatellar bursitis. Differential considerations include hematoma or abscess. 2. Edema involving the prepatellar superficial subcutaneous soft tissues. Correlate for any clinical signs or symptoms of cellulitis. Electronically signed by: Waddell Calk MD 12/22/2023 08:37 AM EST RP Workstation: [id number]   DG Chest Port 1 View Result Date: 12/22/2023 EXAM: 1 VIEW(S) XRAY OF THE CHEST 12/22/2023 07:55:55 AM COMPARISON: None available. CLINICAL HISTORY: Questionable sepsis - evaluate for abnormality FINDINGS: LUNGS AND PLEURA: No focal pulmonary opacity. No pleural effusion. No pneumothorax. HEART AND MEDIASTINUM: No acute abnormality of the  cardiac and mediastinal silhouettes. BONES AND SOFT TISSUES: No acute osseous abnormality. IMPRESSION: 1. No acute cardiopulmonary process identified. Electronically signed by: Camellia Candle MD 12/22/2023 07:58 AM EST RP Workstation: [id number]   DG Knee Complete 4 Views Right Result Date: 12/22/2023 EXAM: 4 OR MORE VIEW(S) Xray of the right knee 12/22/2023 07:16:00 AM COMPARISON: None available. CLINICAL HISTORY: Fall Fall FINDINGS: BONES AND JOINTS: No acute fracture. No malalignment. No significant joint effusion. SOFT TISSUES: The soft tissues are unremarkable. IMPRESSION: 1. Anterior knee soft tissue swelling. Imaging features compatible with prepatellar bursitis. 2. No acute fracture or malalignment. 3. No significant joint effusion. Electronically signed by: Camellia Candle MD 12/22/2023 07:53 AM EST RP Workstation: [id number]   Assessment/Plan Cassandra [name] is a 38 y.o. female with medical history significant for ADHD, anxiety,  Ehlers-Danlos readmitted to the hospital with right knee prepatellar bursitis.  Prepatellar bursitis-with associated cellulitis, patient is not septic.  This could be an aseptic bursitis, but there is some concern for possible septic bursitis as well.  Currently, patient is declining joint aspiration in the ER. -Inpatient admission -Diet as tolerated -P.o. pain control -Empiric IV vancomycin  and IV Rocephin  -Discussed with Ortho Dr. Genelle who will see in consultation  DVT prophylaxis: Lovenox      Code Status: Full Code  Consults called: Ortho  Admission status: The appropriate patient status for this patient is INPATIENT. Inpatient status is judged to be reasonable and necessary in order to provide the required intensity of service to ensure the patient's safety. The patient's presenting symptoms, physical exam findings, and initial radiographic and laboratory data in the context of their chronic comorbidities is felt to place them at high risk for further clinical deterioration. Furthermore, it is not anticipated that the patient will be medically stable for discharge from the hospital within 2 midnights of admission.    I certify that at the point of admission it is my clinical judgment that the patient will require inpatient hospital care spanning beyond 2 midnights from the point of admission due to high intensity of service, high risk for further deterioration and high frequency of surveillance required  Time spent: 49 minutes  Bryston Colocho CHRISTELLA Gail MD [facility] Pager [phone]  If 7PM-7AM, please contact night-coverage [email or web address] Password [email or web address]  12/22/2023, 10:32 AM

## 2023-12-22 NOTE — Progress Notes (Signed)
 Pharmacy Antibiotic Note  Elverda Wendel is a 38 y.o. female admitted on 12/22/2023 with R knee cellulitis with possible underlying septic joint vs bursitis vs abscess.  Pharmacy has been consulted for vancomycin  dosing.  Plan: Vancomycin  1000 mg IV now, then 750 mg IV q24 hr (est AUC 503 based on SCr 0.8; Vd 0.72) Measure vancomycin  AUC at steady state as indicated SCr q48 while on vanc Ceftriaxone  per MD, will adjust to 2g daily given possible joint space involvement   Height: 5' 2 (157.5 cm) Weight: 38.6 kg (85 lb) IBW/kg (Calculated) : 50.1  Temp (24hrs), Avg:98 F (36.7 C), Min:98 F (36.7 C), Max:98 F (36.7 C)  Recent Labs  Lab 12/22/23 0809 12/22/23 0823  WBC  --  11.9*  CREATININE  --  0.53  LATICACIDVEN 1.6  --     Estimated Creatinine Clearance: 58.1 mL/min (by C-G formula based on SCr of 0.53 mg/dL).    Allergies  Allergen Reactions   Metronidazole Nausea And Vomiting   Prednisone Other (See Comments)    irritable/ angry and very lethargic when finishes    Antimicrobials this admission: 12/7 vancomycin  >>  12/7 ceftriaxone  >>   Dose adjustments this admission: N/a  Microbiology results: 12/7 BCx: sent  Thank you for allowing pharmacy to be a part of this patient's care.  Rheta Hemmelgarn A 12/22/2023 10:42 AM

## 2023-12-22 NOTE — Consult Note (Signed)
 ORTHOPAEDIC CONSULTATION  REQUESTING PHYSICIAN: Zella Katha HERO, MD  Chief Complaint: Right knee infection  HPI: Cassandra [name] is a 38 y.o. female who presents with history of MRSA infections presents with evidence of right knee prepatellar septic bursitis.  She does state that this did decompress loss out of the area 2 days prior.  The opening has subsequently closed up.  At this time she is having redness and swelling.  She is able to put weight on the knee and is moving it somewhat  Past Medical History:  Diagnosis Date   ADHD (attention deficit hyperactivity disorder)    Anxiety    Asthma    Kidney stone    Past Surgical History:  Procedure Laterality Date   ELBOW FRACTURE SURGERY     EYE MUSCLE SURGERY Bilateral    OVARIAN CYST REMOVAL Right    TONSILLECTOMY     Social History   Socioeconomic History   Marital status: Single    Spouse name: Not on file   Number of children: Not on file   Years of education: Not on file   Highest education level: Not on file  Occupational History   Occupation: delivery driver    Comment: door dash  Tobacco Use   Smoking status: Every Day    Current packs/day: 0.50    Types: Cigarettes   Smokeless tobacco: Never  Vaping Use   Vaping status: Never Used  Substance and Sexual Activity   Alcohol use: Not Currently   Drug use: Not Currently   Sexual activity: Not Currently  Other Topics Concern   Not on file  Social History Narrative   Not on file   Social Drivers of Health   Financial Resource Strain: Not on file  Food Insecurity: Not on file  Transportation Needs: No Transportation Needs (12/22/2023)   PRAPARE - Transportation    Lack of Transportation (Medical): No    Lack of Transportation (Non-Medical): No  Physical Activity: Not on file  Stress: Not on file  Social Connections: Unknown (05/15/2021)   Received from [facility]   Social Network    Social Network: Not on file   No family history on file. -  negative except otherwise stated in the family history section Allergies  Allergen Reactions   Metronidazole Nausea And Vomiting   Prednisone Other (See Comments)    irritable/ angry and very lethargic when finishes   Prior to Admission medications   Medication Sig Start Date End Date Taking? Authorizing Provider  ADDERALL  XR 30 MG 24 hr capsule Take 30 mg by mouth 2 (two) times daily. 09/01/20   [provider]  baclofen (LIORESAL) 10 MG tablet Take 10 mg by mouth 3 (three) times daily as needed. 08/17/21   [provider]  buprenorphine  (SUBUTEX ) 8 MG SUBL SL tablet Place 8 mg under the tongue 2 (two) times daily. 09/09/20   [provider]  buPROPion  (WELLBUTRIN  SR) 150 MG 12 hr tablet Take 1 tablet (150 mg total) by mouth every morning. 09/21/21   Alexander, Natalie, DO  cloNIDine  (CATAPRES ) 0.2 MG tablet Take 1 tablet (0.2 mg total) by mouth daily for 2 days. 09/22/21 09/24/21  Alexander, Natalie, DO  escitalopram  (LEXAPRO ) 20 MG tablet Take 1 tablet (20 mg total) by mouth daily. 09/21/21   Alexander, Natalie, DO  etonogestrel -ethinyl estradiol  (NUVARING) 0.12-0.015 MG/24HR vaginal ring Insert vaginally and leave in place for 3 consecutive weeks, then remove for 1 week. 10/31/20   Lake Read, MD  gabapentin  (  NEURONTIN ) 300 MG capsule Take 1 capsule (300 mg total) by mouth 2 (two) times daily. 09/21/21   Alexander, Natalie, DO  ibuprofen  (ADVIL ) 600 MG tablet Take 1 tablet (600 mg total) by mouth every 6 (six) hours as needed for up to 30 doses. 01/29/23   Cottie Donnice PARAS, MD  meloxicam  (MOBIC ) 15 MG tablet One tab PO qAM with breakfast for 2 weeks, then daily prn pain. 09/21/21   Alexander, Natalie, DO  Multiple Vitamin (MULTIVITAMIN WITH MINERALS) TABS tablet Take 1 tablet by mouth daily. 09/21/21   Alexander, Natalie, DO  naloxone  (NARCAN ) nasal spray 4 mg/0.1 mL 1 spray once. 07/13/21   [provider]  nicotine  polacrilex (NICORETTE) 2 MG gum SMARTSIG:1 Each By  Mouth Every 2 Hours PRN 05/08/21   [provider]  oxyCODONE  (ROXICODONE ) 5 MG immediate release tablet Take 1 tablet (5 mg total) by mouth every 8 (eight) hours as needed for up to 10 doses for severe pain (pain score 7-10). 01/29/23   Cottie Donnice PARAS, MD  pantoprazole  (PROTONIX ) 40 MG tablet Take 1 tablet (40 mg total) by mouth daily. 09/21/21 10/21/21  Alexander, Natalie, DO  traZODone  (DESYREL ) 100 MG tablet Take 2 tablets (200 mg total) by mouth at bedtime as needed. 09/21/21   Alexander, Natalie, DO   US  RT LOWER EXTREM LTD SOFT TISSUE NON VASCULAR Result Date: 12/22/2023 EXAM: US  right Lower Extremity Nonvascular Soft Tissue Ultrasound TECHNIQUE: Real-time ultrasound scan of the anterior right knee soft tissues with image documentation. COMPARISON: None available. CLINICAL HISTORY: Swelling. Evaluate for bursitis. FINDINGS: SOFT TISSUES: There is subcutaneous soft tissue edema overlying the right knee. Below the subcutaneous fat there is a complex, hypoechoic fluid collection measuring 4.2 x 4.2 x 0.6 cm. IMPRESSION: 1. Complex hypoechoic fluid collection measuring 4.2 x 4.2 x 0.6 cm deep to the subcutaneous fat in the anterior right knee soft tissues, compatible prepatellar bursitis. Differential considerations include hematoma or abscess. 2. Edema involving the prepatellar superficial subcutaneous soft tissues. Correlate for any clinical signs or symptoms of cellulitis. Electronically signed by: Waddell Calk MD 12/22/2023 08:37 AM EST RP Workstation: [id number]   DG Chest Port 1 View Result Date: 12/22/2023 EXAM: 1 VIEW(S) XRAY OF THE CHEST 12/22/2023 07:55:55 AM COMPARISON: None available. CLINICAL HISTORY: Questionable sepsis - evaluate for abnormality FINDINGS: LUNGS AND PLEURA: No focal pulmonary opacity. No pleural effusion. No pneumothorax. HEART AND MEDIASTINUM: No acute abnormality of the cardiac and mediastinal silhouettes. BONES AND SOFT TISSUES: No acute osseous abnormality.  IMPRESSION: 1. No acute cardiopulmonary process identified. Electronically signed by: Camellia Candle MD 12/22/2023 07:58 AM EST RP Workstation: [id number]   DG Knee Complete 4 Views Right Result Date: 12/22/2023 EXAM: 4 OR MORE VIEW(S) Xray of the right knee 12/22/2023 07:16:00 AM COMPARISON: None available. CLINICAL HISTORY: Fall Fall FINDINGS: BONES AND JOINTS: No acute fracture. No malalignment. No significant joint effusion. SOFT TISSUES: The soft tissues are unremarkable. IMPRESSION: 1. Anterior knee soft tissue swelling. Imaging features compatible with prepatellar bursitis. 2. No acute fracture or malalignment. 3. No significant joint effusion. Electronically signed by: Camellia Candle MD 12/22/2023 07:53 AM EST RP Workstation: [id number]     Positive ROS: All other systems have been reviewed and were otherwise negative with the exception of those mentioned in the HPI and as above.  Physical Exam: General: No acute distress Cardiovascular: No pedal edema Respiratory: No cyanosis, no use of accessory musculature GI: No organomegaly, abdomen is soft and non-tender Skin: No lesions in the area of  chief complaint Neurologic: Sensation intact distally Psychiatric: Patient is at baseline mood and affect Lymphatic: No axillary or cervical lymphadenopathy  MUSCULOSKELETAL:  Right knee with redness and swelling in the prepatellar area.  There is a previously closed punctate wound over the front.  Range of motion is from 20 degrees to 100 degrees without significant pain  Independent Imaging Review: 3 views right knee: Normal  Assessment: 38 year old female with right knee prepatellar septic bursitis.  At this time she is refusing aspirations which I do not believe is unreasonable.  There is a high likelihood that this is in fact MRSA given the number of abscesses that pop up throughout the body.  At this time I did encourage for her to take a warm soapy shower and express fluid out of this  previous punctate wound.  I do believe this will be enough for adequate decompression in conjunction with IV antibiotics.  At this time she is deferring surgical treatment which I do believe is reasonable  Plan: Recommend continued soapy water  hot water  decompression at the previous punctate site and IV antibiotics  Thank you for the consult and the opportunity to see Ms. Maranda Elspeth Parker, MD [facility] 11:27 AM

## 2023-12-23 NOTE — Progress Notes (Signed)
 Patient alert & oriented x4 and called ride. Patient left unit via ambulation with all belongs s/p AMA.

## 2023-12-27 LAB — CULTURE, BLOOD (ROUTINE X 2)
Culture: NO GROWTH
Culture: NO GROWTH
